# Patient Record
Sex: Male | Born: 1950 | Race: White | Hispanic: No | Marital: Single | Smoking: Former smoker
Health system: Southern US, Community
[De-identification: ages and names within clinical notes are randomized; demographics above are authoritative.]

## PROBLEM LIST (undated history)

## (undated) DIAGNOSIS — Z9109 Other allergy status, other than to drugs and biological substances: Secondary | ICD-10-CM

## (undated) DIAGNOSIS — J329 Chronic sinusitis, unspecified: Secondary | ICD-10-CM

## (undated) DIAGNOSIS — J45909 Unspecified asthma, uncomplicated: Secondary | ICD-10-CM

## (undated) DIAGNOSIS — F419 Anxiety disorder, unspecified: Secondary | ICD-10-CM

## (undated) DIAGNOSIS — J449 Chronic obstructive pulmonary disease, unspecified: Secondary | ICD-10-CM

## (undated) HISTORY — DX: Unspecified asthma, uncomplicated: J45.909

## (undated) HISTORY — PX: SPINE SURGERY: SHX786

## (undated) HISTORY — DX: Anxiety disorder, unspecified: F41.9

## (undated) HISTORY — DX: Chronic obstructive pulmonary disease, unspecified: J44.9

## (undated) HISTORY — DX: Chronic sinusitis, unspecified: J32.9

## (undated) HISTORY — DX: Other allergy status, other than to drugs and biological substances: Z91.09

---

## 2011-03-01 ENCOUNTER — Emergency Department: Payer: Self-pay | Admitting: Internal Medicine

## 2011-03-17 ENCOUNTER — Emergency Department: Payer: Self-pay | Admitting: Emergency Medicine

## 2011-04-19 ENCOUNTER — Emergency Department: Payer: Self-pay | Admitting: Internal Medicine

## 2016-12-02 DIAGNOSIS — R972 Elevated prostate specific antigen [PSA]: Secondary | ICD-10-CM | POA: Insufficient documentation

## 2016-12-21 DIAGNOSIS — C61 Malignant neoplasm of prostate: Secondary | ICD-10-CM | POA: Insufficient documentation

## 2018-01-10 ENCOUNTER — Encounter: Payer: Self-pay | Admitting: Internal Medicine

## 2018-01-10 ENCOUNTER — Ambulatory Visit (INDEPENDENT_AMBULATORY_CARE_PROVIDER_SITE_OTHER): Payer: PPO | Admitting: Internal Medicine

## 2018-01-10 VITALS — BP 137/80 | HR 68 | Resp 16 | Ht 67.0 in | Wt 177.8 lb

## 2018-01-10 DIAGNOSIS — J449 Chronic obstructive pulmonary disease, unspecified: Secondary | ICD-10-CM

## 2018-01-10 DIAGNOSIS — R0602 Shortness of breath: Secondary | ICD-10-CM | POA: Insufficient documentation

## 2018-01-10 DIAGNOSIS — Z23 Encounter for immunization: Secondary | ICD-10-CM

## 2018-01-10 DIAGNOSIS — C61 Malignant neoplasm of prostate: Secondary | ICD-10-CM

## 2018-01-10 DIAGNOSIS — J439 Emphysema, unspecified: Secondary | ICD-10-CM | POA: Insufficient documentation

## 2018-01-10 MED ORDER — ALBUTEROL SULFATE HFA 108 (90 BASE) MCG/ACT IN AERS: 2.0000 | INHALATION_SPRAY | Freq: Four times a day (QID) | RESPIRATORY_TRACT | 0 refills | Status: DC | PRN

## 2018-01-10 NOTE — Progress Notes (Signed)
[facility] [place], [place] [address]  Pulmonary Sleep Medicine  Office Visit Note  Patient Name: [name] DOB: 12-07-1951 MRN [id number]  Date of Service: 01/10/2018  Complaints/HPI: He is doing well has noted to have severe COPD based on his last PFT. He is not smoking now. He states he will be retiring soon he states. He has no admissions to the hospital. Has noted bronchitis every year. He has no cough noted. He usally gets his infection around the month of January  ROS  General: (-) fever, (-) chills, (-) night sweats, (-) weakness Skin: (-) rashes, (-) itching,. Eyes: (-) visual changes, (-) redness, (-) itching. Nose and Sinuses: (-) nasal stuffiness or itchiness, (-) postnasal drip, (-) nosebleeds, (-) sinus trouble. Mouth and Throat: (-) sore throat, (-) hoarseness. Neck: (-) swollen glands, (-) enlarged thyroid, (-) neck pain. Respiratory: - cough, (-) bloody sputum, - shortness of breath, - wheezing. Cardiovascular: - ankle swelling, (-) chest pain. Lymphatic: (-) lymph node enlargement. Neurologic: (-) numbness, (-) tingling. Psychiatric: (-) anxiety, (-) depression   Current Medication: Outpatient Encounter Medications as of 01/10/2018  Medication Sig  . Albuterol Sulfate (VENTOLIN HFA IN) Inhale 2 puffs into the lungs. Every 4 to 6 hours PRN  . budesonide-formoterol (SYMBICORT) 160-4.5 MCG/ACT inhaler Inhale 2 puffs into the lungs 2 (two) times daily.  Marland Kitchen tiotropium (SPIRIVA HANDIHALER) 18 MCG inhalation capsule Place 18 mcg into inhaler and inhale daily. 1 puff daily   No facility-administered encounter medications on file as of 01/10/2018.     Surgical History: History reviewed. No pertinent surgical history.  Medical History: Past Medical History:  Diagnosis Date  . Anxiety   . Asthma   . COPD (chronic obstructive pulmonary disease) ([place])   . Environmental allergies   . Sinusitis     Family History: Family History   Problem Relation Age of Onset  . Colon cancer Mother   . Hypertension Father     Social History: Social History   Socioeconomic History  . Marital status: Single    Spouse name: Not on file  . Number of children: Not on file  . Years of education: Not on file  . Highest education level: Not on file  Social Needs  . Financial resource strain: Not on file  . Food insecurity - worry: Not on file  . Food insecurity - inability: Not on file  . Transportation needs - medical: Not on file  . Transportation needs - non-medical: Not on file  Occupational History  . Not on file  Tobacco Use  . Smoking status: Former Research scientist (life sciences)  . Smokeless tobacco: Never Used  Substance and Sexual Activity  . Alcohol use: Not on file    Comment: occational  . Drug use: No  . Sexual activity: Not on file  Other Topics Concern  . Not on file  Social History Narrative  . Not on file    Vital Signs: Blood pressure 137/80, pulse 68, resp. rate 16, height 5\' 7"  (1.702 m), weight 177 lb 12.8 oz (80.6 kg), SpO2 98 %.  Examination: General Appearance: The patient is well-developed, well-nourished, and in no distress. Skin: Gross inspection of skin unremarkable. Head: normocephalic, no gross deformities. Eyes: no gross deformities noted. ENT: ears appear grossly normal no exudates. Neck: Supple. No thyromegaly. No LAD. Respiratory: no rales or rhonchi. Cardiovascular: Normal S1 and S2 without murmur or rub. Extremities: No cyanosis. pulses are equal. Neurologic: Alert and oriented. No involuntary movements.  LABS: No results found for this or any previous visit (from the past 2160 hour(s)).  Radiology: No results found.  No results found.  No results found.    Assessment and Plan: Patient Active Problem List   Diagnosis Date Noted  . COPD (chronic obstructive pulmonary disease) ([place]) 01/10/2018  . Emphysema, unspecified ([place]) 01/10/2018  . Shortness of breath 01/10/2018  . Prostate  cancer ([place]) 12/21/2016  . Elevated PSA 12/02/2016    1. COPD doing well he has been on symbicort but his insurance has been giving him a difficulty time with his ventolin will likely need a generic script for albuterol flu shot 2. SOB worse when he does not have his inhalers 3. Prostate cancer controlled follows with urology cancer center  General Counseling: I have discussed the findings of the evaluation and examination with [name].  I have also discussed any further diagnostic evaluation thatmay be needed or ordered today. [name] verbalizes understanding of the findings of todays visit. We also reviewed his medications today and discussed drug interactions and side effects including but not limited excessive drowsiness and altered mental states. We also discussed that there is always a risk not just to him but also people around him. he has been encouraged to call the office with any questions or concerns that should arise related to todays visit.    Time spent: 51min  I have personally obtained a history, examined the patient, evaluated laboratory and imaging results, formulated the assessment and plan and placed orders.    Dennis Gee, MD [facility] Pulmonary and Critical Care Sleep medicine

## 2018-01-10 NOTE — Patient Instructions (Signed)

## 2018-03-18 ENCOUNTER — Other Ambulatory Visit: Payer: Self-pay | Admitting: Internal Medicine

## 2018-04-18 ENCOUNTER — Other Ambulatory Visit: Payer: Self-pay | Admitting: Internal Medicine

## 2018-04-18 DIAGNOSIS — J449 Chronic obstructive pulmonary disease, unspecified: Secondary | ICD-10-CM

## 2018-06-05 DIAGNOSIS — M2550 Pain in unspecified joint: Secondary | ICD-10-CM | POA: Diagnosis not present

## 2018-06-05 DIAGNOSIS — R5381 Other malaise: Secondary | ICD-10-CM | POA: Diagnosis not present

## 2018-06-08 DIAGNOSIS — R5381 Other malaise: Secondary | ICD-10-CM | POA: Diagnosis not present

## 2018-06-08 DIAGNOSIS — R972 Elevated prostate specific antigen [PSA]: Secondary | ICD-10-CM | POA: Diagnosis not present

## 2018-07-11 ENCOUNTER — Encounter: Payer: Self-pay | Admitting: Internal Medicine

## 2018-07-11 ENCOUNTER — Ambulatory Visit (INDEPENDENT_AMBULATORY_CARE_PROVIDER_SITE_OTHER): Payer: PPO | Admitting: Internal Medicine

## 2018-07-11 VITALS — BP 126/68 | HR 85 | Resp 16 | Ht 67.0 in | Wt 169.0 lb

## 2018-07-11 DIAGNOSIS — J449 Chronic obstructive pulmonary disease, unspecified: Secondary | ICD-10-CM

## 2018-07-11 DIAGNOSIS — C61 Malignant neoplasm of prostate: Secondary | ICD-10-CM | POA: Diagnosis not present

## 2018-07-11 DIAGNOSIS — R0602 Shortness of breath: Secondary | ICD-10-CM | POA: Diagnosis not present

## 2018-07-11 NOTE — Progress Notes (Signed)
[facility] [place], [place] [address]  Pulmonary Sleep Medicine   Office Visit Note  Patient Name: [name] DOB: 03-09-1951 MRN [id number]  Date of Service: 07/11/2018  Complaints/HPI: Pt here for follow up for COPD.  He reports having to use his rescue inhaler more recently.  He attributes it to the weather, and anytime he "over does it". He denies hospital admissions, chest pain or cough at this time.  There is no anoxic hemoptysis noted at this time.  No nausea no vomiting denies having any headaches  ROS  General: (-) fever, (-) chills, (-) night sweats, (-) weakness Skin: (-) rashes, (-) itching,. Eyes: (-) visual changes, (-) redness, (-) itching. Nose and Sinuses: (-) nasal stuffiness or itchiness, (-) postnasal drip, (-) nosebleeds, (-) sinus trouble. Mouth and Throat: (-) sore throat, (-) hoarseness. Neck: (-) swollen glands, (-) enlarged thyroid, (-) neck pain. Respiratory: - cough, (-) bloody sputum, + shortness of breath, - wheezing. Cardiovascular: - ankle swelling, (-) chest pain. Lymphatic: (-) lymph node enlargement. Neurologic: (-) numbness, (-) tingling. Psychiatric: (-) anxiety, (-) depression   Current Medication: Outpatient Encounter Medications as of 07/11/2018  Medication Sig  . PROAIR HFA 108 (90 Base) MCG/ACT inhaler INHALE 2 PUFFS EVERY 4-6 HOURS AS NEEDED  . SYMBICORT 160-4.5 MCG/ACT inhaler INHALE 2 PUFFS BY MOUTH TWICE A DAY  . tiotropium (SPIRIVA HANDIHALER) 18 MCG inhalation capsule Place 18 mcg into inhaler and inhale daily. 1 puff daily   No facility-administered encounter medications on file as of 07/11/2018.     Surgical History: No past surgical history on file.  Medical History: Past Medical History:  Diagnosis Date  . Anxiety   . Asthma   . COPD (chronic obstructive pulmonary disease) ([place])   . Environmental allergies   . Sinusitis     Family History: Family History  Problem Relation Age of  Onset  . Colon cancer Mother   . Hypertension Father     Social History: Social History   Socioeconomic History  . Marital status: Single    Spouse name: Not on file  . Number of children: Not on file  . Years of education: Not on file  . Highest education level: Not on file  Occupational History  . Not on file  Social Needs  . Financial resource strain: Not on file  . Food insecurity:    Worry: Not on file    Inability: Not on file  . Transportation needs:    Medical: Not on file    Non-medical: Not on file  Tobacco Use  . Smoking status: Former Research scientist (life sciences)  . Smokeless tobacco: Never Used  Substance and Sexual Activity  . Alcohol use: Not on file    Comment: occational  . Drug use: No  . Sexual activity: Not on file  Lifestyle  . Physical activity:    Days per week: Not on file    Minutes per session: Not on file  . Stress: Not on file  Relationships  . Social connections:    Talks on phone: Not on file    Gets together: Not on file    Attends religious service: Not on file    Active member of club or organization: Not on file    Attends meetings of clubs or organizations: Not on file    Relationship status: Not on file  . Intimate partner violence:    Fear of current or ex partner: Not on file    Emotionally  abused: Not on file    Physically abused: Not on file    Forced sexual activity: Not on file  Other Topics Concern  . Not on file  Social History Narrative  . Not on file    Vital Signs: Blood pressure 126/68, pulse 85, resp. rate 16, height 5\' 7"  (1.702 m), weight 169 lb (76.7 kg), SpO2 96 %.  Examination: General Appearance: The patient is well-developed, well-nourished, and in no distress. Skin: Gross inspection of skin unremarkable. Head: normocephalic, no gross deformities. Eyes: no gross deformities noted. ENT: ears appear grossly normal no exudates. Neck: Supple. No thyromegaly. No LAD. Respiratory: Scattered distant rhonchi are  noted. Cardiovascular: Normal S1 and S2 without murmur or rub. Extremities: No cyanosis. pulses are equal. Neurologic: Alert and oriented. No involuntary movements.  LABS: No results found for this or any previous visit (from the past 2160 hour(s)).  Radiology: No results found.  No results found.  No results found.    Assessment and Plan: Patient Active Problem List   Diagnosis Date Noted  . COPD (chronic obstructive pulmonary disease) ([place]) 01/10/2018  . Emphysema, unspecified ([place]) 01/10/2018  . Shortness of breath 01/10/2018  . Prostate cancer ([place]) 12/21/2016  . Elevated PSA 12/02/2016   1. Chronic obstructive pulmonary disease, unspecified COPD type ([place]) Denies difficulty at this time.  Doing well on Symbicort. Continues to work a physical job.  - DG Chest 2 View; Future  2. SOB (shortness of breath) - Spirometry with Graph  3. Prostate cancer ([place]) Stable at this time.  Followed by oncology.     General Counseling: I have discussed the findings of the evaluation and examination with [name].  I have also discussed any further diagnostic evaluation thatmay be needed or ordered today. [name] verbalizes understanding of the findings of todays visit. We also reviewed his medications today and discussed drug interactions and side effects including but not limited excessive drowsiness and altered mental states. We also discussed that there is always a risk not just to him but also people around him. he has been encouraged to call the office with any questions or concerns that should arise related to todays visit.    Time spent: 24min This patient was seen by Orson Gear AGNP-C in Collaboration with Dr. Devona Konig as a part of collaborative care agreement. I have personally obtained a history, examined the patient, evaluated laboratory and imaging results, formulated the assessment and plan and placed orders.    Allyne Gee, MD [facility] Pulmonary and Critical Care Sleep  medicine

## 2018-07-11 NOTE — Patient Instructions (Signed)

## 2018-07-14 ENCOUNTER — Ambulatory Visit
Admission: RE | Admit: 2018-07-14 | Discharge: 2018-07-14 | Disposition: A | Discharge status: Home / Self Care | Payer: PPO | Source: Ambulatory Visit | Attending: Adult Health | Admitting: Adult Health

## 2018-07-14 ENCOUNTER — Ambulatory Visit
Admission: RE | Admit: 2018-07-14 | Discharge: 2018-07-14 | Disposition: A | Discharge status: Home / Self Care | Payer: PPO | Source: Ambulatory Visit | Attending: Internal Medicine | Admitting: Internal Medicine

## 2018-07-14 DIAGNOSIS — J449 Chronic obstructive pulmonary disease, unspecified: Secondary | ICD-10-CM | POA: Diagnosis not present

## 2018-07-20 ENCOUNTER — Ambulatory Visit: Payer: Self-pay | Admitting: Urology

## 2018-08-08 ENCOUNTER — Other Ambulatory Visit: Payer: Self-pay | Admitting: Internal Medicine

## 2018-08-09 ENCOUNTER — Telehealth: Payer: Self-pay

## 2018-08-09 ENCOUNTER — Other Ambulatory Visit: Payer: Self-pay | Admitting: Internal Medicine

## 2018-08-09 NOTE — Telephone Encounter (Signed)
Error

## 2018-09-11 ENCOUNTER — Other Ambulatory Visit: Payer: Self-pay | Admitting: Internal Medicine

## 2018-09-11 DIAGNOSIS — J449 Chronic obstructive pulmonary disease, unspecified: Secondary | ICD-10-CM

## 2018-11-10 DIAGNOSIS — K2900 Acute gastritis without bleeding: Secondary | ICD-10-CM | POA: Diagnosis not present

## 2018-11-11 ENCOUNTER — Other Ambulatory Visit: Payer: Self-pay | Admitting: Adult Health

## 2018-11-13 DIAGNOSIS — R42 Dizziness and giddiness: Secondary | ICD-10-CM | POA: Diagnosis not present

## 2018-11-13 DIAGNOSIS — R109 Unspecified abdominal pain: Secondary | ICD-10-CM | POA: Diagnosis not present

## 2018-11-13 DIAGNOSIS — R197 Diarrhea, unspecified: Secondary | ICD-10-CM | POA: Diagnosis not present

## 2018-11-13 DIAGNOSIS — K2900 Acute gastritis without bleeding: Secondary | ICD-10-CM | POA: Diagnosis not present

## 2018-12-15 DIAGNOSIS — M25561 Pain in right knee: Secondary | ICD-10-CM | POA: Diagnosis not present

## 2018-12-19 DIAGNOSIS — S76911A Strain of unspecified muscles, fascia and tendons at thigh level, right thigh, initial encounter: Secondary | ICD-10-CM | POA: Diagnosis not present

## 2018-12-19 DIAGNOSIS — M1711 Unilateral primary osteoarthritis, right knee: Secondary | ICD-10-CM | POA: Diagnosis not present

## 2019-01-07 ENCOUNTER — Other Ambulatory Visit: Payer: Self-pay | Admitting: Internal Medicine

## 2019-01-07 DIAGNOSIS — J449 Chronic obstructive pulmonary disease, unspecified: Secondary | ICD-10-CM

## 2019-01-08 ENCOUNTER — Other Ambulatory Visit: Payer: Self-pay | Admitting: Adult Health

## 2019-01-10 ENCOUNTER — Other Ambulatory Visit: Payer: Self-pay | Admitting: Internal Medicine

## 2019-01-18 ENCOUNTER — Encounter: Payer: Self-pay | Admitting: Adult Health

## 2019-01-18 ENCOUNTER — Encounter (INDEPENDENT_AMBULATORY_CARE_PROVIDER_SITE_OTHER): Payer: Self-pay

## 2019-01-18 ENCOUNTER — Ambulatory Visit: Payer: PPO | Admitting: Adult Health

## 2019-01-18 VITALS — BP 132/76 | HR 73 | Resp 16 | Ht 67.0 in | Wt 176.0 lb

## 2019-01-18 DIAGNOSIS — J45909 Unspecified asthma, uncomplicated: Secondary | ICD-10-CM | POA: Diagnosis not present

## 2019-01-18 DIAGNOSIS — J449 Chronic obstructive pulmonary disease, unspecified: Secondary | ICD-10-CM

## 2019-01-18 DIAGNOSIS — R0602 Shortness of breath: Secondary | ICD-10-CM

## 2019-01-18 NOTE — Patient Instructions (Signed)
Chronic Obstructive Pulmonary Disease Chronic obstructive pulmonary disease (COPD) is a long-term (chronic) lung problem. When you have COPD, it is hard for air to get in and out of your lungs. Usually the condition gets worse over time, and your lungs will never return to normal. There are things you can do to keep yourself as healthy as possible.  Your doctor may treat your condition with: ? Medicines. ? Oxygen. ? Lung surgery.  Your doctor may also recommend: ? Rehabilitation. This includes steps to make your body work better. It may involve a team of specialists. ? Quitting smoking, if you smoke. ? Exercise and changes to your diet. ? Comfort measures (palliative care). Follow these instructions at home: Medicines  Take over-the-counter and prescription medicines only as told by your doctor.  Talk to your doctor before taking any cough or allergy medicines. You may need to avoid medicines that cause your lungs to be dry. Lifestyle  If you smoke, stop. Smoking makes the problem worse. If you need help quitting, ask your doctor.  Avoid being around things that make your breathing worse. This may include smoke, chemicals, and fumes.  Stay active, but remember to rest as well.  Learn and use tips on how to relax.  Make sure you get enough sleep. Most adults need at least 7 hours of sleep every night.  Eat healthy foods. Eat smaller meals more often. Rest before meals. Controlled breathing Learn and use tips on how to control your breathing as told by your doctor. Try:  Breathing in (inhaling) through your nose for 1 second. Then, pucker your lips and breath out (exhale) through your lips for 2 seconds.  Putting one hand on your belly (abdomen). Breathe in slowly through your nose for 1 second. Your hand on your belly should move out. Pucker your lips and breathe out slowly through your lips. Your hand on your belly should move in as you breathe out.  Controlled coughing Learn  and use controlled coughing to clear mucus from your lungs. Follow these steps: 1. Lean your head a little forward. 2. Breathe in deeply. 3. Try to hold your breath for 3 seconds. 4. Keep your mouth slightly open while coughing 2 times. 5. Spit any mucus out into a tissue. 6. Rest and do the steps again 1 or 2 times as needed. General instructions  Make sure you get all the shots (vaccines) that your doctor recommends. Ask your doctor about a flu shot and a pneumonia shot.  Use oxygen therapy and pulmonary rehabilitation if told by your doctor. If you need home oxygen therapy, ask your doctor if you should buy a tool to measure your oxygen level (oximeter).  Make a COPD action plan with your doctor. This helps you to know what to do if you feel worse than usual.  Manage any other conditions you have as told by your doctor.  Avoid going outside when it is very hot, cold, or humid.  Avoid people who have a sickness you can catch (contagious).  Keep all follow-up visits as told by your doctor. This is important. Contact a doctor if:  You cough up more mucus than usual.  There is a change in the color or thickness of the mucus.  It is harder to breathe than usual.  Your breathing is faster than usual.  You have trouble sleeping.  You need to use your medicines more often than usual.  You have trouble doing your normal activities such as getting dressed   or walking around the house. Get help right away if:  You have shortness of breath while resting.  You have shortness of breath that stops you from: ? Being able to talk. ? Doing normal activities.  Your chest hurts for longer than 5 minutes.  Your skin color is more blue than usual.  Your pulse oximeter shows that you have low oxygen for longer than 5 minutes.  You have a fever.  You feel too tired to breathe normally. Summary  Chronic obstructive pulmonary disease (COPD) is a long-term lung problem.  The way your  lungs work will never return to normal. Usually the condition gets worse over time. There are things you can do to keep yourself as healthy as possible.  Take over-the-counter and prescription medicines only as told by your doctor.  If you smoke, stop. Smoking makes the problem worse. This information is not intended to replace advice given to you by your health care provider. Make sure you discuss any questions you have with your health care provider. Document Released: 05/17/2008 Document Revised: 01/03/2017 Document Reviewed: 01/03/2017 Elsevier Interactive Patient Education  2019 Elsevier Inc.  

## 2019-01-18 NOTE — Progress Notes (Signed)
[facility] [place], [place] [address]  Pulmonary Sleep Medicine   Office Visit Note  Patient Name: [name] DOB: 06-08-1951 MRN [id number]  Date of Service: 01/18/2019  Complaints/HPI: Pt is here for follow up.  Patient reports overall he is doing well.  Denies any hospital admissions, chest pain, cough, shortness of breath or other issues.   ROS  General: (-) fever, (-) chills, (-) night sweats, (-) weakness Skin: (-) rashes, (-) itching,. Eyes: (-) visual changes, (-) redness, (-) itching. Nose and Sinuses: (-) nasal stuffiness or itchiness, (-) postnasal drip, (-) nosebleeds, (-) sinus trouble. Mouth and Throat: (-) sore throat, (-) hoarseness. Neck: (-) swollen glands, (-) enlarged thyroid, (-) neck pain. Respiratory: - cough, (-) bloody sputum, - shortness of breath, - wheezing. Cardiovascular: - ankle swelling, (-) chest pain. Lymphatic: (-) lymph node enlargement. Neurologic: (-) numbness, (-) tingling. Psychiatric: (-) anxiety, (-) depression   Current Medication: Outpatient Encounter Medications as of 01/18/2019  Medication Sig  . albuterol (PROVENTIL HFA;VENTOLIN HFA) 108 (90 Base) MCG/ACT inhaler INHALE 2 PUFFS BY MOUTH EVERY 4 TO 6 HOURS AS NEEDED  . meloxicam (MOBIC) 15 MG tablet meloxicam 15 mg tablet  TAKE 1 TABLET BY MOUTH EVERY DAY  . SPIRIVA HANDIHALER 18 MCG inhalation capsule INHALE 1 PUFF BY MOUTH EVERY DAY  . SYMBICORT 160-4.5 MCG/ACT inhaler TAKE 2 PUFFS BY MOUTH TWICE A DAY   No facility-administered encounter medications on file as of 01/18/2019.     Surgical History: History reviewed. No pertinent surgical history.  Medical History: Past Medical History:  Diagnosis Date  . Anxiety   . Asthma   . COPD (chronic obstructive pulmonary disease) ([place])   . Environmental allergies   . Sinusitis     Family History: Family History  Problem Relation Age of Onset  . Colon cancer Mother   . Hypertension Father      Social History: Social History   Socioeconomic History  . Marital status: Single    Spouse name: Not on file  . Number of children: Not on file  . Years of education: Not on file  . Highest education level: Not on file  Occupational History  . Not on file  Social Needs  . Financial resource strain: Not on file  . Food insecurity:    Worry: Not on file    Inability: Not on file  . Transportation needs:    Medical: Not on file    Non-medical: Not on file  Tobacco Use  . Smoking status: Former Research scientist (life sciences)  . Smokeless tobacco: Never Used  Substance and Sexual Activity  . Alcohol use: Not on file    Comment: occational  . Drug use: No  . Sexual activity: Not on file  Lifestyle  . Physical activity:    Days per week: Not on file    Minutes per session: Not on file  . Stress: Not on file  Relationships  . Social connections:    Talks on phone: Not on file    Gets together: Not on file    Attends religious service: Not on file    Active member of club or organization: Not on file    Attends meetings of clubs or organizations: Not on file    Relationship status: Not on file  . Intimate partner violence:    Fear of current or ex partner: Not on file    Emotionally abused: Not on file    Physically abused: Not on file  Forced sexual activity: Not on file  Other Topics Concern  . Not on file  Social History Narrative  . Not on file    Vital Signs: Blood pressure 132/76, pulse 73, resp. rate 16, height 5\' 7"  (1.702 m), weight 176 lb (79.8 kg), SpO2 96 %.  Examination: General Appearance: The patient is well-developed, well-nourished, and in no distress. Skin: Gross inspection of skin unremarkable. Head: normocephalic, no gross deformities. Eyes: no gross deformities noted. ENT: ears appear grossly normal no exudates. Neck: Supple. No thyromegaly. No LAD. Respiratory: clear bilaterally. Cardiovascular: Normal S1 and S2 without murmur or rub. Extremities: No  cyanosis. pulses are equal. Neurologic: Alert and oriented. No involuntary movements.  LABS: No results found for this or any previous visit (from the past 2160 hour(s)).  Radiology: Dg Chest 2 View  Result Date: 07/14/2018 CLINICAL DATA:  COPD. EXAM: CHEST - 2 VIEW COMPARISON:  03/01/2011 chest radiograph. FINDINGS: Stable cardiomediastinal silhouette with normal heart size. No pneumothorax. No pleural effusion. Borderline mild lung hyperinflation. No pulmonary edema. No acute consolidative airspace disease. IMPRESSION: No acute cardiopulmonary disease. Borderline mild lung hyperinflation, compatible with the provided history of COPD. Electronically Signed   By: Ilona Sorrel M.D.   On: 07/14/2018 16:34    No results found.  No results found.    Assessment and Plan: Patient Active Problem List   Diagnosis Date Noted  . COPD (chronic obstructive pulmonary disease) ([place]) 01/10/2018  . Emphysema, unspecified ([place]) 01/10/2018  . Shortness of breath 01/10/2018  . Prostate cancer ([place]) 12/21/2016  . Elevated PSA 12/02/2016    1. Chronic obstructive pulmonary disease, unspecified COPD type ([place]) Doing well at this time.  Currently uses Spiriva and Symbicort as well as an Adderall rescue inhaler and is doing well with these at this time.  Continue present management.  2. Asthma due to environmental allergies Stable at this time.  Continue using inhalers and medications as prescribed.  3. SOB (shortness of breath) FVC is 2.4 which is 58% of the pre-predicted value FEV1 is 1.3 which is 82% of the pre-predicted value FEV1/FVC is 53 9 which is 71% of the pre-predicted value on today spirometry - Spirometry with Graph  General Counseling: I have discussed the findings of the evaluation and examination with [name].  I have also discussed any further diagnostic evaluation thatmay be needed or ordered today. Larone verbalizes understanding of the findings of todays visit. We also reviewed his  medications today and discussed drug interactions and side effects including but not limited excessive drowsiness and altered mental states. We also discussed that there is always a risk not just to him but also people around him. he has been encouraged to call the office with any questions or concerns that should arise related to todays visit.    Time spent: 25 This patient was seen by Orson Gear AGNP-C in Collaboration with Dr. Devona Konig as a part of collaborative care agreement.  I have personally obtained a history, examined the patient, evaluated laboratory and imaging results, formulated the assessment and plan and placed orders.    Allyne Gee, MD [facility] Pulmonary and Critical Care Sleep medicine

## 2019-02-02 DIAGNOSIS — R030 Elevated blood-pressure reading, without diagnosis of hypertension: Secondary | ICD-10-CM | POA: Diagnosis not present

## 2019-02-02 DIAGNOSIS — J019 Acute sinusitis, unspecified: Secondary | ICD-10-CM | POA: Diagnosis not present

## 2019-02-02 DIAGNOSIS — J209 Acute bronchitis, unspecified: Secondary | ICD-10-CM | POA: Diagnosis not present

## 2019-02-02 DIAGNOSIS — B9689 Other specified bacterial agents as the cause of diseases classified elsewhere: Secondary | ICD-10-CM | POA: Diagnosis not present

## 2019-02-08 ENCOUNTER — Encounter: Payer: Self-pay | Admitting: Emergency Medicine

## 2019-02-08 ENCOUNTER — Emergency Department: Payer: PPO

## 2019-02-08 ENCOUNTER — Emergency Department
Admission: EM | Admit: 2019-02-08 | Discharge: 2019-02-08 | Disposition: A | Discharge status: Home / Self Care | Payer: PPO | Attending: Emergency Medicine | Admitting: Emergency Medicine

## 2019-02-08 DIAGNOSIS — Z8546 Personal history of malignant neoplasm of prostate: Secondary | ICD-10-CM | POA: Diagnosis not present

## 2019-02-08 DIAGNOSIS — J40 Bronchitis, not specified as acute or chronic: Secondary | ICD-10-CM | POA: Diagnosis not present

## 2019-02-08 DIAGNOSIS — R0981 Nasal congestion: Secondary | ICD-10-CM | POA: Diagnosis not present

## 2019-02-08 DIAGNOSIS — R0789 Other chest pain: Secondary | ICD-10-CM | POA: Insufficient documentation

## 2019-02-08 DIAGNOSIS — J449 Chronic obstructive pulmonary disease, unspecified: Secondary | ICD-10-CM | POA: Diagnosis not present

## 2019-02-08 DIAGNOSIS — R0602 Shortness of breath: Secondary | ICD-10-CM | POA: Diagnosis not present

## 2019-02-08 DIAGNOSIS — R079 Chest pain, unspecified: Secondary | ICD-10-CM | POA: Diagnosis not present

## 2019-02-08 DIAGNOSIS — Z87891 Personal history of nicotine dependence: Secondary | ICD-10-CM | POA: Diagnosis not present

## 2019-02-08 DIAGNOSIS — R05 Cough: Secondary | ICD-10-CM | POA: Diagnosis not present

## 2019-02-08 DIAGNOSIS — R531 Weakness: Secondary | ICD-10-CM | POA: Diagnosis not present

## 2019-02-08 LAB — BASIC METABOLIC PANEL
Anion gap: 10 (ref 5–15)
BUN: 16 mg/dL (ref 8–23)
CO2: 25 mmol/L (ref 22–32)
Calcium: 9.5 mg/dL (ref 8.9–10.3)
Chloride: 100 mmol/L (ref 98–111)
Creatinine, Ser: 1 mg/dL (ref 0.61–1.24)
GFR calc Af Amer: >60 mL/min (ref >60)
GFR calc non Af Amer: >60 mL/min (ref >60)
Glucose, Bld: 102 mg/dL — ABNORMAL HIGH (ref 70–99)
Potassium: 4.1 mmol/L (ref 3.5–5.1)
Sodium: 135 mmol/L (ref 135–145)

## 2019-02-08 LAB — CBC
HEMATOCRIT: 42 % (ref 39.0–52.0)
HEMOGLOBIN: 14.5 g/dL (ref 13.0–17.0)
MCH: 30.2 pg (ref 26.0–34.0)
MCHC: 34.5 g/dL (ref 30.0–36.0)
MCV: 87.5 fL (ref 80.0–100.0)
Platelets: 173 10*3/uL (ref 150–400)
RBC: 4.8 MIL/uL (ref 4.22–5.81)
RDW: 13.2 % (ref 11.5–15.5)
WBC: 8.8 10*3/uL (ref 4.0–10.5)
nRBC: 0 % (ref 0.0–0.2)

## 2019-02-08 LAB — TROPONIN I
Troponin I: <0.03 ng/mL (ref <0.03)
Troponin I: <0.03 ng/mL (ref <0.03)

## 2019-02-08 MED ORDER — DEXAMETHASONE SODIUM PHOSPHATE 10 MG/ML IJ SOLN
10.0000 mg | Freq: Once | INTRAMUSCULAR | Status: AC
  Administered 2019-02-08: 10 mg via INTRAMUSCULAR
  Filled 2019-02-08: qty 1

## 2019-02-08 MED ORDER — IPRATROPIUM-ALBUTEROL 0.5-2.5 (3) MG/3ML IN SOLN
3.0000 mL | Freq: Once | RESPIRATORY_TRACT | Status: AC
  Administered 2019-02-08: 3 mL via RESPIRATORY_TRACT
  Filled 2019-02-08: qty 3

## 2019-02-08 NOTE — ED Triage Notes (Signed)
Pt reports he has been taking an antibiotic and he still has 7 days left of it.

## 2019-02-08 NOTE — ED Triage Notes (Signed)
Pt reports that he went to the walk in clinic to follow up from a cold he had because he was still having some coughing and wheezing. Pt states they sent him over here due to the continued sx's.

## 2019-02-08 NOTE — Discharge Instructions (Signed)
In addition to the medication you are currently taking, you may also take Mucinex (guaifenesin) to help with cough and congestion.  Follow up with primary care for symptoms that have not improved by Monday.   Return to the ER if you notice shortness of breath or chest pain/tightness are worsening.

## 2019-02-08 NOTE — ED Provider Notes (Signed)
[facility] Emergency Department Provider Note  ____________________________________________   First MD Initiated Contact with Patient 02/08/19 2045     (approximate)  I have reviewed the triage vital signs and the nursing notes.   HISTORY  Chief Complaint Chest Pain; Chills; Weakness; and Shortness of Breath  HPI [name] is a 68 y.o. male who presents to the ER for flu like symptoms x 7 days. Cough, congestion, chest tightness. Five days ago he saw the [facility] and was given Doxycycline and benzonate. Complaint today is that he is not completely better and has to work over the weekend and wants something to help with his symptoms.    Past Medical History:  Diagnosis Date  . Anxiety   . Asthma   . COPD (chronic obstructive pulmonary disease) ([place])   . Environmental allergies   . Sinusitis     Patient Active Problem List   Diagnosis Date Noted  . COPD (chronic obstructive pulmonary disease) ([place]) 01/10/2018  . Emphysema, unspecified ([place]) 01/10/2018  . Shortness of breath 01/10/2018  . Prostate cancer ([place]) 12/21/2016  . Elevated PSA 12/02/2016    History reviewed. No pertinent surgical history.  Prior to Admission medications   Medication Sig Start Date End Date Taking? Authorizing Provider  albuterol (PROVENTIL HFA;VENTOLIN HFA) 108 (90 Base) MCG/ACT inhaler INHALE 2 PUFFS BY MOUTH EVERY 4 TO 6 HOURS AS NEEDED 01/08/19   Scar[place], Audie Clear, NP  meloxicam (MOBIC) 15 MG tablet meloxicam 15 mg tablet  TAKE 1 TABLET BY MOUTH EVERY DAY    [provider]  SPIRIVA HANDIHALER 18 MCG inhalation capsule INHALE 1 PUFF BY MOUTH EVERY DAY 01/08/19   Kendell Bane, NP  SYMBICORT 160-4.5 MCG/ACT inhaler TAKE 2 PUFFS BY MOUTH TWICE A DAY 01/10/19   Kendell Bane, NP    Allergies Penicillins and Codeine  Family History  Problem Relation Age of Onset  . Colon cancer Mother   . Hypertension Father     Social History Social  History   Tobacco Use  . Smoking status: Former Research scientist (life sciences)  . Smokeless tobacco: Never Used  Substance Use Topics  . Alcohol use: Not on file    Comment: occational  . Drug use: No    Review of Systems  Constitutional: No fever/positive for chills Eyes: No visual changes. ENT: No sore throat. Cardiovascular: Denies chest pain. Respiratory: Positive for shortness of breath. Gastrointestinal: No abdominal pain.  No nausea, no vomiting.  No diarrhea. Genitourinary: Negative for dysuria. Musculoskeletal: Negative for back pain. Skin: Negative for rash. Neurological: Negative for headaches, focal weakness or numbness. ____________________________________________   PHYSICAL EXAM:  VITAL SIGNS: ED Triage Vitals  Enc Vitals Group     BP 02/08/19 1400 (!) 162/86     Pulse Rate 02/08/19 1400 71     Resp --      Temp 02/08/19 1400 98.9 F (37.2 C)     Temp Source 02/08/19 1400 Oral     SpO2 02/08/19 1400 97 %     Weight 02/08/19 1400 177 lb (80.3 kg)     Height 02/08/19 1400 5\' 7"  (1.702 m)     Head Circumference --      Peak Flow --      Pain Score 02/08/19 1406 0     Pain Loc --      Pain Edu? --      Excl. in [place]? --     Constitutional: Alert and oriented. Well appearing  and in no acute distress. Eyes: Conjunctivae are normal. Head: Atraumatic. Nose: No congestion/rhinnorhea. Mouth/Throat: Mucous membranes are moist.  Oropharynx mildly erythematous.  Neck: No stridor.   Cardiovascular: Normal rate, regular rhythm. Grossly normal heart sounds.  Good peripheral circulation. Respiratory: Normal respiratory effort.  No retractions. Faint expiratory wheezing. Gastrointestinal: Soft and nontender. No distention. No abdominal bruits. Musculoskeletal: No lower extremity tenderness nor edema.  No joint effusions. Neurologic:  Normal speech and language. No gross focal neurologic deficits are appreciated. No gait instability. Skin:  Skin is warm, dry and intact. No rash  noted. Psychiatric: Mood and affect are normal. Speech and behavior are normal. ___________________________________________   LABS (all labs ordered are listed, but only abnormal results are displayed)  Labs Reviewed  BASIC METABOLIC PANEL - Abnormal; Notable for the following components:      Result Value   Glucose, Bld 102 (*)    All other components within normal limits  CBC  TROPONIN I  TROPONIN I   ____________________________________________  EKG ED ECG REPORT I, Delle Andrzejewski, FNP-BC personally viewed and interpreted this ECG.   Date: 02/08/2019  EKG Time: 1356  Rate: 69  Rhythm: normal EKG, normal sinus rhythm  Axis: normal  Intervals:none  ST&T Change: No ST elevation.  ____________________________________________  RADIOLOGY  ED MD interpretation:  No acute cardiopulmonary abnormality per radiology.  Official radiology report(s): Dg Chest 2 View  Result Date: 02/08/2019 CLINICAL DATA:  68 y/o  M; chest pain and productive cough. EXAM: CHEST - 2 VIEW COMPARISON:  07/14/2018 chest radiograph FINDINGS: Stable heart size and mediastinal contours are within normal limits. Both lungs are clear. The visualized skeletal structures are unremarkable. IMPRESSION: No acute pulmonary process identified. Electronically Signed   By: Kristine Garbe M.D.   On: 02/08/2019 15:07    ____________________________________________   PROCEDURES  Procedure(s) performed (including Critical Care):  Procedures   ____________________________________________   INITIAL IMPRESSION / ASSESSMENT AND PLAN / ED COURSE  As part of my medical decision making, I reviewed the following data within the electronic MEDICAL RECORD NUMBER Notes from prior Internal Medicine appointment.    68 year old male presents to the emergency department for treatment and evaluation of URI symptoms with cough and wheeze.  Despite taking doxycycline and using his inhalers, he has still had some  coughing and wheezing and does not feel that he is going to be able to go to work this weekend.  He has had some chest tightness but relates that more to COPD/emphysema.  2 serial troponins were negative.  Chest x-ray showed no concern for acute cardiopulmonary abnormalities.  EKG was reassuring as well.  There is no ST segment elevation.  Oxygen saturation was in the high 90s.  He does have some expiratory wheezing and was given a DuoNeb with significant improvement.  The patient states that when he takes prednisone, he has joint soreness and prefers not to take it if at all possible therefore he will be given an IM injection of Decadron.  Patient was advised that he may need to see primary care in a few days if he notices that the wheezing has not resolved.  He was encouraged to continue using his inhalers as prescribed.  He will finish taking the doxycycline as prescribed as well.  He was advised that he may also take guaifenesin to help with cough and congestion.  He will be provided a work note for the weekend.  He was encouraged to return to the emergency department for symptoms  of change or worsen if he is unable to schedule an appointment with primary care.      ____________________________________________   FINAL CLINICAL IMPRESSION(S) / ED DIAGNOSES  Final diagnoses:  Bronchitis     ED Discharge Orders    None       Note:  This document was prepared using Dragon voice recognition software and may include unintentional dictation errors.    Victorino Dike, FNP 02/08/19 2252    Harvest Dark, MD 02/08/19 2329

## 2019-03-11 ENCOUNTER — Other Ambulatory Visit: Payer: Self-pay | Admitting: Adult Health

## 2019-03-30 ENCOUNTER — Other Ambulatory Visit: Payer: Self-pay | Admitting: Adult Health

## 2019-03-30 DIAGNOSIS — J449 Chronic obstructive pulmonary disease, unspecified: Secondary | ICD-10-CM

## 2019-06-08 ENCOUNTER — Other Ambulatory Visit: Payer: Self-pay | Admitting: Internal Medicine

## 2019-06-21 ENCOUNTER — Telehealth: Payer: Self-pay

## 2019-06-21 NOTE — Telephone Encounter (Signed)
Gave Orders for [facility] for pt Oxygen supply RX. [name]

## 2019-07-05 ENCOUNTER — Other Ambulatory Visit: Payer: Self-pay | Admitting: Adult Health

## 2019-07-09 ENCOUNTER — Other Ambulatory Visit: Payer: Self-pay

## 2019-07-09 MED ORDER — BUDESONIDE-FORMOTEROL FUMARATE 160-4.5 MCG/ACT IN AERO: INHALATION_SPRAY | RESPIRATORY_TRACT | 4 refills | Status: DC

## 2019-07-23 ENCOUNTER — Encounter: Payer: Self-pay | Admitting: Internal Medicine

## 2019-07-23 ENCOUNTER — Other Ambulatory Visit: Payer: Self-pay

## 2019-07-23 ENCOUNTER — Ambulatory Visit: Payer: PPO | Admitting: Internal Medicine

## 2019-07-23 VITALS — BP 184/84 | HR 66 | Resp 16 | Ht 67.0 in | Wt 186.0 lb

## 2019-07-23 DIAGNOSIS — R0602 Shortness of breath: Secondary | ICD-10-CM | POA: Diagnosis not present

## 2019-07-23 DIAGNOSIS — J45909 Unspecified asthma, uncomplicated: Secondary | ICD-10-CM

## 2019-07-23 DIAGNOSIS — J449 Chronic obstructive pulmonary disease, unspecified: Secondary | ICD-10-CM

## 2019-07-23 MED ORDER — ALBUTEROL SULFATE (2.5 MG/3ML) 0.083% IN NEBU: 2.5000 mg | INHALATION_SOLUTION | Freq: Four times a day (QID) | RESPIRATORY_TRACT | 1 refills | Status: AC | PRN

## 2019-07-23 NOTE — Progress Notes (Signed)
[facility] [place], [place] [address]  Pulmonary Sleep Medicine   Office Visit Note  Patient Name: [name] DOB: 02/11/1951 MRN [id number]  Date of Service: 07/23/2019  Complaints/HPI: Pt is here for 6 month follow up. Pt reports overall he is doing well.  Pt is using Symbicort, spiriva and pro-air as prescribed.  He denies needs at this time.  His bp is elevated, and he reports he is working with his PCP to address this.    ROS  General: (-) fever, (-) chills, (-) night sweats, (-) weakness Skin: (-) rashes, (-) itching,. Eyes: (-) visual changes, (-) redness, (-) itching. Nose and Sinuses: (-) nasal stuffiness or itchiness, (-) postnasal drip, (-) nosebleeds, (-) sinus trouble. Mouth and Throat: (-) sore throat, (-) hoarseness. Neck: (-) swollen glands, (-) enlarged thyroid, (-) neck pain. Respiratory: - cough, (-) bloody sputum, - shortness of breath, - wheezing. Cardiovascular: - ankle swelling, (-) chest pain. Lymphatic: (-) lymph node enlargement. Neurologic: (-) numbness, (-) tingling. Psychiatric: (-) anxiety, (-) depression   Current Medication: Outpatient Encounter Medications as of 07/23/2019  Medication Sig  . budesonide-formoterol (SYMBICORT) 160-4.5 MCG/ACT inhaler TAKE 2 PUFFS BY MOUTH TWICE A DAY  . meloxicam (MOBIC) 15 MG tablet meloxicam 15 mg tablet  TAKE 1 TABLET BY MOUTH EVERY DAY  . PROAIR HFA 108 (90 Base) MCG/ACT inhaler TAKE 2 PUFFS BY MOUTH EVERY 4 TO 6 HOURS AS NEEDED  . SPIRIVA HANDIHALER 18 MCG inhalation capsule INHALE 1 CAPSULE VIA HANDIHALER ONCE DAILY AT THE SAME TIME EVERY DAY   No facility-administered encounter medications on file as of 07/23/2019.     Surgical History: No past surgical history on file.  Medical History: Past Medical History:  Diagnosis Date  . Anxiety   . Asthma   . COPD (chronic obstructive pulmonary disease) ([place])   . Environmental allergies   . Sinusitis     Family  History: Family History  Problem Relation Age of Onset  . Colon cancer Mother   . Hypertension Father     Social History: Social History   Socioeconomic History  . Marital status: Single    Spouse name: Not on file  . Number of children: Not on file  . Years of education: Not on file  . Highest education level: Not on file  Occupational History  . Not on file  Social Needs  . Financial resource strain: Not on file  . Food insecurity    Worry: Not on file    Inability: Not on file  . Transportation needs    Medical: Not on file    Non-medical: Not on file  Tobacco Use  . Smoking status: Former Research scientist (life sciences)  . Smokeless tobacco: Never Used  Substance and Sexual Activity  . Alcohol use: Not on file    Comment: occational  . Drug use: No  . Sexual activity: Not on file  Lifestyle  . Physical activity    Days per week: Not on file    Minutes per session: Not on file  . Stress: Not on file  Relationships  . Social Herbalist on phone: Not on file    Gets together: Not on file    Attends religious service: Not on file    Active member of club or organization: Not on file    Attends meetings of clubs or organizations: Not on file    Relationship status: Not on file  . Intimate partner violence  Fear of current or ex partner: Not on file    Emotionally abused: Not on file    Physically abused: Not on file    Forced sexual activity: Not on file  Other Topics Concern  . Not on file  Social History Narrative  . Not on file    Vital Signs: Blood pressure (!) 184/84, pulse 66, resp. rate 16, height 5\' 7"  (1.702 m), weight 186 lb (84.4 kg), SpO2 94 %.  Examination: General Appearance: The patient is well-developed, well-nourished, and in no distress. Skin: Gross inspection of skin unremarkable. Head: normocephalic, no gross deformities. Eyes: no gross deformities noted. ENT: ears appear grossly normal no exudates. Neck: Supple. No thyromegaly. No  LAD. Respiratory: clear bilaterally. Cardiovascular: Normal S1 and S2 without murmur or rub. Extremities: No cyanosis. pulses are equal. Neurologic: Alert and oriented. No involuntary movements.  LABS: No results found for this or any previous visit (from the past 2160 hour(s)).  Radiology: Dg Chest 2 View  Result Date: 02/08/2019 CLINICAL DATA:  68 y/o  M; chest pain and productive cough. EXAM: CHEST - 2 VIEW COMPARISON:  07/14/2018 chest radiograph FINDINGS: Stable heart size and mediastinal contours are within normal limits. Both lungs are clear. The visualized skeletal structures are unremarkable. IMPRESSION: No acute pulmonary process identified. Electronically Signed   By: Kristine Garbe M.D.   On: 02/08/2019 15:07    No results found.  No results found.    Assessment and Plan: Patient Active Problem List   Diagnosis Date Noted  . COPD (chronic obstructive pulmonary disease) ([place]) 01/10/2018  . Emphysema, unspecified ([place]) 01/10/2018  . Shortness of breath 01/10/2018  . Prostate cancer ([place]) 12/21/2016  . Elevated PSA 12/02/2016   1. Chronic obstructive pulmonary disease, unspecified COPD type ([place]) Continue to use inhalers as directed.  Filled patients albuterol nebulizer solution.   - albuterol (PROVENTIL) (2.5 MG/3ML) 0.083% nebulizer solution; Take 3 mLs (2.5 mg total) by nebulization every 6 (six) hours as needed for wheezing or shortness of breath.  Dispense: 75 mL; Refill: 1  2. Asthma due to environmental allergies Stable, continue present management.    3. SOB (shortness of breath) - Spirometry with Graph   General Counseling: I have discussed the findings of the evaluation and examination with [name].  I have also discussed any further diagnostic evaluation thatmay be needed or ordered today. [name] verbalizes understanding of the findings of todays visit. We also reviewed his medications today and discussed drug interactions and side effects including  but not limited excessive drowsiness and altered mental states. We also discussed that there is always a risk not just to him but also people around him. he has been encouraged to call the office with any questions or concerns that should arise related to todays visit.    Time spent: 15 This patient was seen by Orson Gear AGNP-C in Collaboration with Dr. Devona Konig as a part of collaborative care agreement.   I have personally obtained a history, examined the patient, evaluated laboratory and imaging results, formulated the assessment and plan and placed orders.    Allyne Gee, MD [facility] Pulmonary and Critical Care Sleep medicine

## 2019-08-07 ENCOUNTER — Other Ambulatory Visit: Payer: Self-pay | Admitting: Adult Health

## 2019-08-07 DIAGNOSIS — J449 Chronic obstructive pulmonary disease, unspecified: Secondary | ICD-10-CM

## 2019-09-07 ENCOUNTER — Other Ambulatory Visit: Payer: Self-pay

## 2019-11-16 ENCOUNTER — Other Ambulatory Visit: Payer: Self-pay | Admitting: Adult Health

## 2019-12-21 ENCOUNTER — Other Ambulatory Visit: Payer: Self-pay | Admitting: Adult Health

## 2019-12-26 IMAGING — CR DG CHEST 2V
2 series · 2 of 2 positions shown · non-contrast
Comparison: 07/14/2018 chest radiograph

CLINICAL DATA: 67 y/o  M; chest pain and productive cough.

EXAM:
CHEST - 2 VIEW

[chest pa]
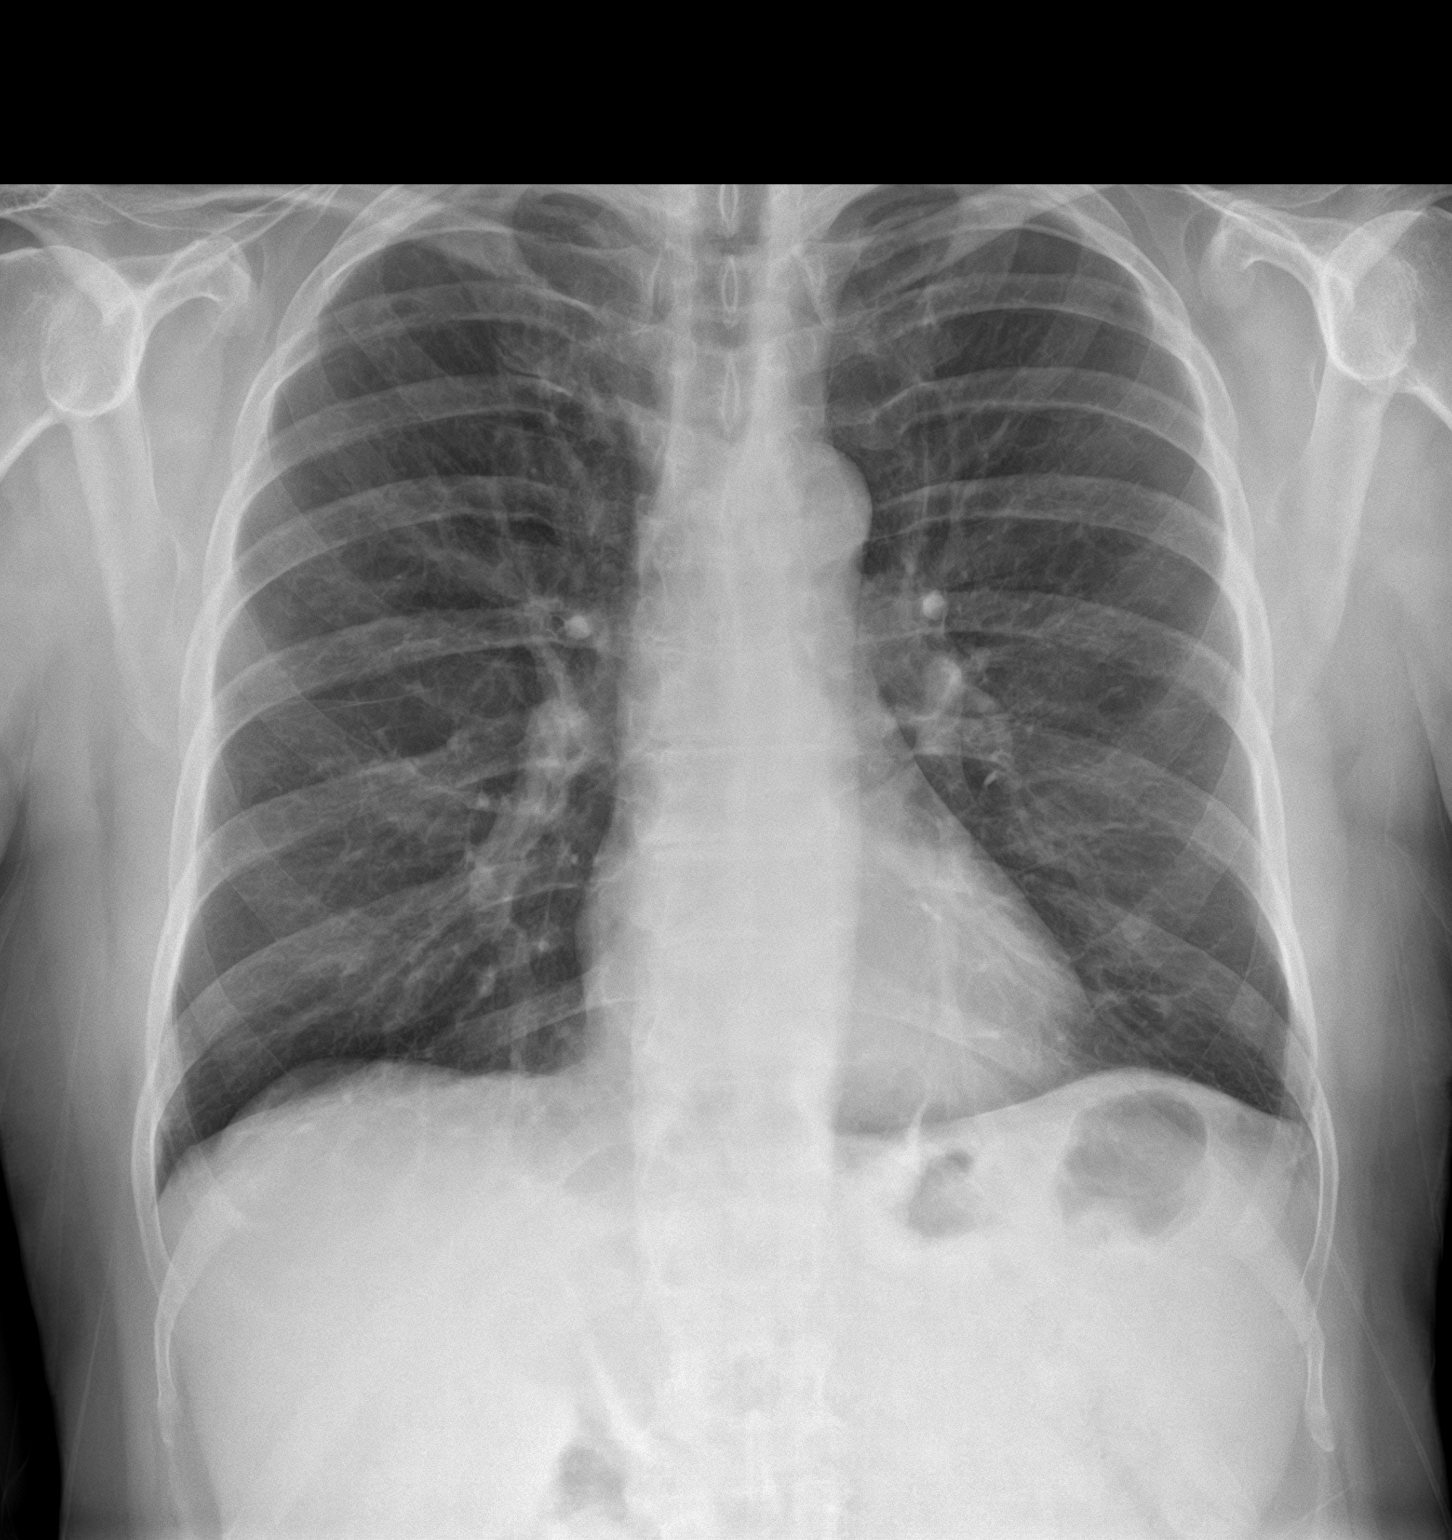

[chest lat]
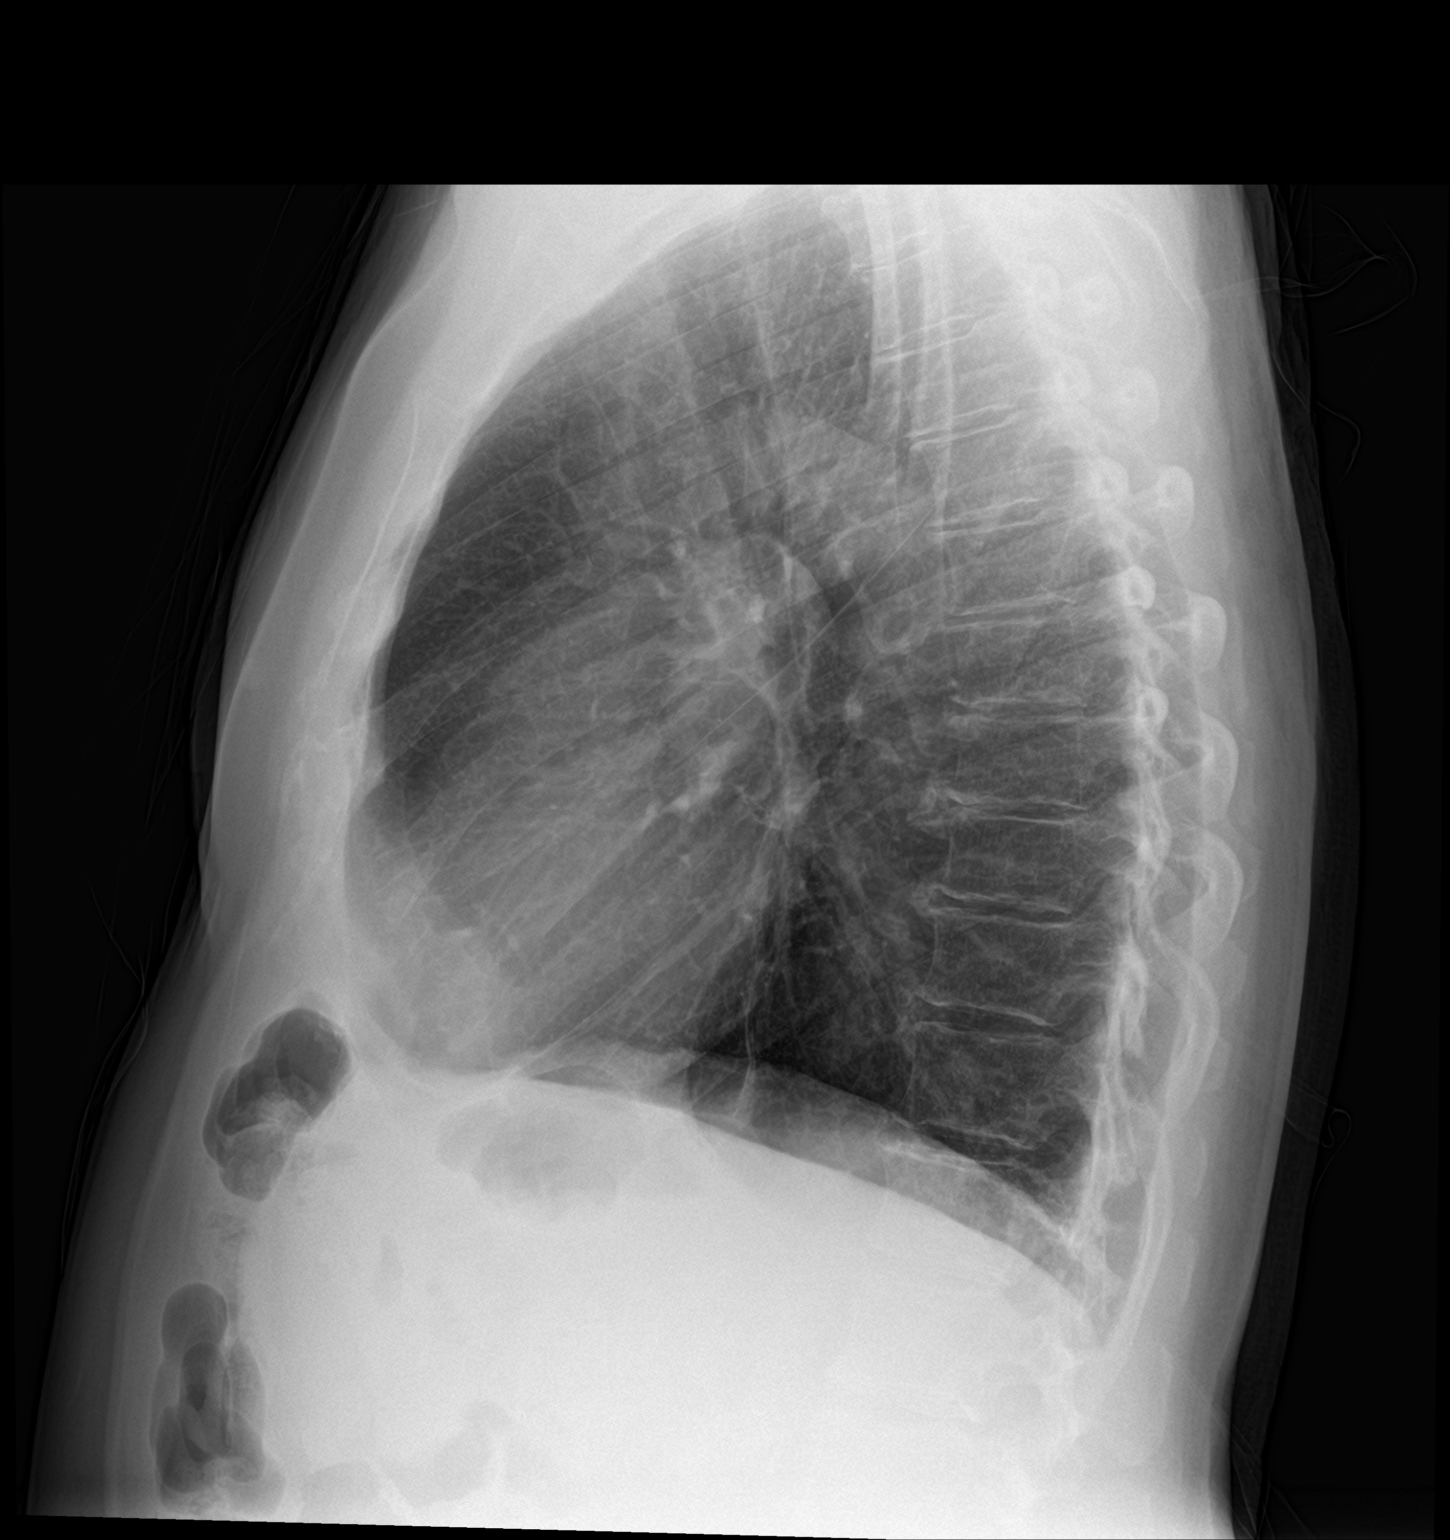

[2 of 2 positions shown; findings below may reference images not displayed]

FINDINGS: Stable heart size and mediastinal contours are within normal limits.
Both lungs are clear. The visualized skeletal structures are
unremarkable.
IMPRESSION: No acute pulmonary process identified.

## 2020-01-24 ENCOUNTER — Telehealth: Payer: Self-pay

## 2020-01-24 NOTE — Telephone Encounter (Signed)
Called lmom informing patient of appointment on 01/28/2020. klh

## 2020-01-28 ENCOUNTER — Other Ambulatory Visit: Payer: Self-pay

## 2020-01-28 ENCOUNTER — Ambulatory Visit (INDEPENDENT_AMBULATORY_CARE_PROVIDER_SITE_OTHER): Payer: PPO | Admitting: Internal Medicine

## 2020-01-28 ENCOUNTER — Encounter: Payer: Self-pay | Admitting: Internal Medicine

## 2020-01-28 VITALS — BP 164/69 | HR 63 | Temp 97.4°F | Resp 16 | Ht 67.0 in | Wt 183.1 lb

## 2020-01-28 DIAGNOSIS — J45909 Unspecified asthma, uncomplicated: Secondary | ICD-10-CM

## 2020-01-28 DIAGNOSIS — J449 Chronic obstructive pulmonary disease, unspecified: Secondary | ICD-10-CM | POA: Diagnosis not present

## 2020-01-28 DIAGNOSIS — I10 Essential (primary) hypertension: Secondary | ICD-10-CM | POA: Diagnosis not present

## 2020-01-28 DIAGNOSIS — R0602 Shortness of breath: Secondary | ICD-10-CM

## 2020-01-28 NOTE — Progress Notes (Signed)
[facility] [place], [place] [address]  Pulmonary Sleep Medicine   Office Visit Note  Patient Name: [name] DOB: 05-Sep-1951 MRN [id number]  Date of Service: 01/28/2020  Complaints/HPI: Pt is seen for pulmonary follow up.  He is doing well at this time.  He continues to use his inhalers without difficulty.  He currently uses Spiriva and Symbicort for maintenance.  He also has a Programme researcher, broadcasting/film/video inhaler. He sleeping with oxygen at night on 2 lpm.  His blood pressure is slightly elevated today, 164/69.  Denies Chest pain, Shortness of breath, palpitations, headache, or blurred vision.   ROS  General: (-) fever, (-) chills, (-) night sweats, (-) weakness Skin: (-) rashes, (-) itching,. Eyes: (-) visual changes, (-) redness, (-) itching. Nose and Sinuses: (-) nasal stuffiness or itchiness, (-) postnasal drip, (-) nosebleeds, (-) sinus trouble. Mouth and Throat: (-) sore throat, (-) hoarseness. Neck: (-) swollen glands, (-) enlarged thyroid, (-) neck pain. Respiratory: - cough, (-) bloody sputum, - shortness of breath, - wheezing. Cardiovascular: - ankle swelling, (-) chest pain. Lymphatic: (-) lymph node enlargement. Neurologic: (-) numbness, (-) tingling. Psychiatric: (-) anxiety, (-) depression   Current Medication: Outpatient Encounter Medications as of 01/28/2020  Medication Sig  . albuterol (PROVENTIL) (2.5 MG/3ML) 0.083% nebulizer solution Take 3 mLs (2.5 mg total) by nebulization every 6 (six) hours as needed for wheezing or shortness of breath.  . budesonide-formoterol (SYMBICORT) 160-4.5 MCG/ACT inhaler TAKE 2 PUFFS BY MOUTH TWICE A DAY  . meloxicam (MOBIC) 15 MG tablet meloxicam 15 mg tablet  TAKE 1 TABLET BY MOUTH EVERY DAY  . PROAIR HFA 108 (90 Base) MCG/ACT inhaler INHALE 2 PUFFS BY MOUTH EVERY 4 TO 6 HOURS AS NEEDED  . SPIRIVA HANDIHALER 18 MCG inhalation capsule INHALE 1 CAPSULE VIA HANDIHALER ONCE DAILY AT THE SAME TIME EVERY DAY   No  facility-administered encounter medications on file as of 01/28/2020.    Surgical History: History reviewed. No pertinent surgical history.  Medical History: Past Medical History:  Diagnosis Date  . Anxiety   . Asthma   . COPD (chronic obstructive pulmonary disease) ([place])   . Environmental allergies   . Sinusitis     Family History: Family History  Problem Relation Age of Onset  . Colon cancer Mother   . Hypertension Father     Social History: Social History   Socioeconomic History  . Marital status: Single    Spouse name: Not on file  . Number of children: Not on file  . Years of education: Not on file  . Highest education level: Not on file  Occupational History  . Not on file  Tobacco Use  . Smoking status: Former Research scientist (life sciences)  . Smokeless tobacco: Never Used  Substance and Sexual Activity  . Alcohol use: Not Currently    Comment: occational  . Drug use: No  . Sexual activity: Not on file  Other Topics Concern  . Not on file  Social History Narrative  . Not on file   Social Determinants of Health   Financial Resource Strain:   . Difficulty of Paying Living Expenses: Not on file  Food Insecurity:   . Worried About Charity fundraiser in the Last Year: Not on file  . Ran Out of Food in the Last Year: Not on file  Transportation Needs:   . Lack of Transportation (Medical): Not on file  . Lack of Transportation (Non-Medical): Not on file  Physical Activity:   .  Days of Exercise per Week: Not on file  . Minutes of Exercise per Session: Not on file  Stress:   . Feeling of Stress : Not on file  Social Connections:   . Frequency of Communication with Friends and Family: Not on file  . Frequency of Social Gatherings with Friends and Family: Not on file  . Attends Religious Services: Not on file  . Active Member of Clubs or Organizations: Not on file  . Attends Archivist Meetings: Not on file  . Marital Status: Not on file  Intimate Partner Violence:    . Fear of Current or Ex-Partner: Not on file  . Emotionally Abused: Not on file  . Physically Abused: Not on file  . Sexually Abused: Not on file    Vital Signs: Blood pressure (!) 164/69, pulse 63, temperature (!) 97.4 F (36.3 C), resp. rate 16, height 5\' 7"  (1.702 m), weight 183 lb 1.6 oz (83.1 kg), SpO2 97 %.  Examination: General Appearance: The patient is well-developed, well-nourished, and in no distress. Skin: Gross inspection of skin unremarkable. Head: normocephalic, no gross deformities. Eyes: no gross deformities noted. ENT: ears appear grossly normal no exudates. Neck: Supple. No thyromegaly. No LAD. Respiratory: clear bilaterally. Cardiovascular: Normal S1 and S2 without murmur or rub. Extremities: No cyanosis. pulses are equal. Neurologic: Alert and oriented. No involuntary movements.  LABS: No results found for this or any previous visit (from the past 2160 hour(s)).  Radiology: DG Chest 2 View  Result Date: 02/08/2019 CLINICAL DATA:  69 y/o  M; chest pain and productive cough. EXAM: CHEST - 2 VIEW COMPARISON:  07/14/2018 chest radiograph FINDINGS: Stable heart size and mediastinal contours are within normal limits. Both lungs are clear. The visualized skeletal structures are unremarkable. IMPRESSION: No acute pulmonary process identified. Electronically Signed   By: Kristine Garbe M.D.   On: 02/08/2019 15:07    No results found.  No results found.    Assessment and Plan: Patient Active Problem List   Diagnosis Date Noted  . COPD (chronic obstructive pulmonary disease) ([place]) 01/10/2018  . Emphysema, unspecified ([place]) 01/10/2018  . Shortness of breath 01/10/2018  . Prostate cancer ([place]) 12/21/2016  . Elevated PSA 12/02/2016    1. Asthma due to environmental allergies Continue to use inhalers as prescribed.   2. Chronic obstructive pulmonary disease, unspecified COPD type ([place]) Stable, continue present management.   3. SOB (shortness of  breath) FEV1 is 1.2 which is 41% of pre-predicted value. - Spirometry with Graph  4. Essential hypertension Slightly elevated, managed by pcp.  General Counseling: I have discussed the findings of the evaluation and examination with [name].  I have also discussed any further diagnostic evaluation thatmay be needed or ordered today. Fernanda verbalizes understanding of the findings of todays visit. We also reviewed his medications today and discussed drug interactions and side effects including but not limited excessive drowsiness and altered mental states. We also discussed that there is always a risk not just to him but also people around him. he has been encouraged to call the office with any questions or concerns that should arise related to todays visit.  Orders Placed This Encounter  Procedures  . Spirometry with Graph    Order Specific Question:   Where should this test be performed?    Answer:   [facility]  . Pulmonary Function Test    Standing Status:   Future    Standing Expiration Date:   01/27/2021  Order Specific Question:   Where should this test be performed?    Answer:   [facility]     Time spent: 30 includes 10 minutes for chart review This patient was seen by Orson Gear AGNP-C in Collaboration with Dr. Devona Konig as a part of collaborative care agreement.   I have personally obtained a history, examined the patient, evaluated laboratory and imaging results, formulated the assessment and plan and placed orders.    Allyne Gee, MD [facility] Pulmonary and Critical Care Sleep medicine

## 2020-02-07 ENCOUNTER — Ambulatory Visit: Payer: PPO | Attending: Internal Medicine

## 2020-02-07 DIAGNOSIS — Z23 Encounter for immunization: Secondary | ICD-10-CM | POA: Insufficient documentation

## 2020-02-07 NOTE — Progress Notes (Signed)
   Covid-19 Vaccination Clinic  Name:  [name]    MRN: [id number] DOB: 07-18-1951  02/07/2020  [name] was observed post Covid-19 immunization for 15 minutes without incidence. He was provided with Vaccine Information Sheet and instruction to access the V-Safe system.   [name] was instructed to call 911 with any severe reactions post vaccine: Marland Kitchen Difficulty breathing  . Swelling of your face and throat  . A fast heartbeat  . A bad rash all over your body  . Dizziness and weakness    Immunizations Administered    Name Date Dose VIS Date Route   Pfizer COVID-19 Vaccine 02/07/2020  9:41 AM 0.3 mL 11/23/2019 Intramuscular   Manufacturer: [place]   Lot: [id number]   [place]: [id number]

## 2020-02-18 ENCOUNTER — Telehealth: Payer: Self-pay

## 2020-02-18 NOTE — Telephone Encounter (Signed)
Confirmed appointment on 02/20/2020 and screened for covid. klh

## 2020-02-20 ENCOUNTER — Other Ambulatory Visit: Payer: Self-pay

## 2020-02-20 ENCOUNTER — Ambulatory Visit: Payer: PPO | Admitting: Internal Medicine

## 2020-02-20 DIAGNOSIS — R0602 Shortness of breath: Secondary | ICD-10-CM | POA: Diagnosis not present

## 2020-02-20 LAB — PULMONARY FUNCTION TEST

## 2020-02-24 NOTE — Procedures (Signed)
[facility] [place], [address]  DATE OF SERVICE: February 20, 2020  Complete Pulmonary Function Testing Interpretation:  FINDINGS:  Forced vital capacity is moderately decreased.  FEV1 is 1.10 L and is 37% of predicted which is severely decreased.  FEV1 FVC ratio is severely decreased.  Postbronchodilator there is no significant improvement in FEV1 however clinical improvement may still occur in absence of spirometric improvement.  Total capacity is normal residual volume is increased residual in total lung capacity ratio is increased.  FRC is normal.  DLCO is severely decreased.  DLCO corrected for alveolar volume is decreased.  IMPRESSION:  This pulmonary function study is consistent with severe obstructive lung disease.  No response to bronchodilators clinical correlation recommended  Allyne Gee, MD [facility] Pulmonary Critical Care Medicine Sleep Medicine

## 2020-02-26 ENCOUNTER — Telehealth: Payer: Self-pay

## 2020-02-26 NOTE — Telephone Encounter (Signed)
Faxed order to [place] home[facility] for overnight oximetry test and service concentrator. beth

## 2020-03-04 ENCOUNTER — Ambulatory Visit: Payer: PPO | Attending: Internal Medicine

## 2020-03-04 DIAGNOSIS — Z23 Encounter for immunization: Secondary | ICD-10-CM

## 2020-03-04 NOTE — Progress Notes (Signed)
   Covid-19 Vaccination Clinic  Name:  [name]    MRN: [id number] DOB: [birth year]  03/04/2020  [name] was observed post Covid-19 immunization for 15 minutes without incident. He was provided with Vaccine Information Sheet and instruction to access the V-Safe system.   [name] was instructed to call 911 with any severe reactions post vaccine: Marland Kitchen Difficulty breathing  . Swelling of face and throat  . A fast heartbeat  . A bad rash all over body  . Dizziness and weakness   Immunizations Administered    Name Date Dose VIS Date Route   Pfizer COVID-19 Vaccine 03/04/2020  9:27 AM 0.3 mL 11/23/2019 Intramuscular   Manufacturer: [place]   Lot: [id number]   [place]: [id number]

## 2020-03-06 ENCOUNTER — Telehealth: Payer: Self-pay

## 2020-03-06 NOTE — Telephone Encounter (Signed)
Called lmom/no vm informing patient of appointment on 03/10/2020. klh

## 2020-03-10 ENCOUNTER — Other Ambulatory Visit: Payer: Self-pay

## 2020-03-10 ENCOUNTER — Encounter: Payer: Self-pay | Admitting: Internal Medicine

## 2020-03-10 ENCOUNTER — Telehealth: Payer: Self-pay

## 2020-03-10 ENCOUNTER — Ambulatory Visit (INDEPENDENT_AMBULATORY_CARE_PROVIDER_SITE_OTHER): Payer: PPO | Admitting: Internal Medicine

## 2020-03-10 VITALS — BP 188/86 | HR 75 | Temp 97.3°F | Resp 16 | Ht 67.0 in | Wt 181.0 lb

## 2020-03-10 DIAGNOSIS — J449 Chronic obstructive pulmonary disease, unspecified: Secondary | ICD-10-CM

## 2020-03-10 DIAGNOSIS — G4734 Idiopathic sleep related nonobstructive alveolar hypoventilation: Secondary | ICD-10-CM | POA: Diagnosis not present

## 2020-03-10 DIAGNOSIS — J45909 Unspecified asthma, uncomplicated: Secondary | ICD-10-CM

## 2020-03-10 DIAGNOSIS — I10 Essential (primary) hypertension: Secondary | ICD-10-CM | POA: Diagnosis not present

## 2020-03-10 MED ORDER — ALBUTEROL SULFATE HFA 108 (90 BASE) MCG/ACT IN AERS: INHALATION_SPRAY | RESPIRATORY_TRACT | 3 refills | Status: DC

## 2020-03-10 MED ORDER — BUDESONIDE-FORMOTEROL FUMARATE 160-4.5 MCG/ACT IN AERO: INHALATION_SPRAY | RESPIRATORY_TRACT | 4 refills | Status: DC

## 2020-03-10 NOTE — Telephone Encounter (Signed)
Faxed and put copy in [place] homepatient folder for O2 concentrator supplies and service. Beth

## 2020-03-10 NOTE — Progress Notes (Signed)
[facility] [place], [place] [address]  Pulmonary Sleep Medicine   Office Visit Note  Patient Name: [name] DOB: 1951/02/26 MRN [id number]  Date of Service: 03/10/2020  Complaints/HPI: Patient is here today to review results of overnight oximetry. He has been on oxygen at night in the past but stopped because the machine was broken. Repeated overnight oximetry to ensure he still needed oxygen at night. Results show 103.5 minutes with SpO2 levels below 88% and 143.7 minutes with SpO2 below 89%. Average HR 66, lowest HR recorded 54 beats per minute. Will order new oxygen concentrator for overnight for him to wear 2LPM via [place] at night. He reports that when he was able to wear oxygen at night he would have much more energy during the day. He has noticed feeling fatigued during the day without wearing his oxygen. Denies shortness of breath, cough or chest pain.  ROS  General: (-) fever, (-) chills, (-) night sweats, (-) weakness Skin: (-) rashes, (-) itching,. Eyes: (-) visual changes, (-) redness, (-) itching. Nose and Sinuses: (-) nasal stuffiness or itchiness, (-) postnasal drip, (-) nosebleeds, (-) sinus trouble. Mouth and Throat: (-) sore throat, (-) hoarseness. Neck: (-) swollen glands, (-) enlarged thyroid, (-) neck pain. Respiratory: - cough, (-) bloody sputum, - shortness of breath, - wheezing. Cardiovascular: - ankle swelling, (-) chest pain. Lymphatic: (-) lymph node enlargement. Neurologic: (-) numbness, (-) tingling. Psychiatric: (-) anxiety, (-) depression   Current Medication: Outpatient Encounter Medications as of 03/10/2020  Medication Sig  . albuterol (PROAIR HFA) 108 (90 Base) MCG/ACT inhaler INHALE 2 PUFFS BY MOUTH EVERY 4 TO 6 HOURS AS NEEDED  . albuterol (PROVENTIL) (2.5 MG/3ML) 0.083% nebulizer solution Take 3 mLs (2.5 mg total) by nebulization every 6 (six) hours as needed for wheezing or shortness of breath.  .  budesonide-formoterol (SYMBICORT) 160-4.5 MCG/ACT inhaler TAKE 2 PUFFS BY MOUTH TWICE A DAY  . meloxicam (MOBIC) 15 MG tablet meloxicam 15 mg tablet  TAKE 1 TABLET BY MOUTH EVERY DAY  . SPIRIVA HANDIHALER 18 MCG inhalation capsule INHALE 1 CAPSULE VIA HANDIHALER ONCE DAILY AT THE SAME TIME EVERY DAY  . [DISCONTINUED] budesonide-formoterol (SYMBICORT) 160-4.5 MCG/ACT inhaler TAKE 2 PUFFS BY MOUTH TWICE A DAY  . [DISCONTINUED] PROAIR HFA 108 (90 Base) MCG/ACT inhaler INHALE 2 PUFFS BY MOUTH EVERY 4 TO 6 HOURS AS NEEDED   No facility-administered encounter medications on file as of 03/10/2020.    Surgical History: History reviewed. No pertinent surgical history.  Medical History: Past Medical History:  Diagnosis Date  . Anxiety   . Asthma   . COPD (chronic obstructive pulmonary disease) ([place])   . Environmental allergies   . Sinusitis     Family History: Family History  Problem Relation Age of Onset  . Colon cancer Mother   . Hypertension Father     Social History: Social History   Socioeconomic History  . Marital status: Single    Spouse name: Not on file  . Number of children: Not on file  . Years of education: Not on file  . Highest education level: Not on file  Occupational History  . Not on file  Tobacco Use  . Smoking status: Former Research scientist (life sciences)  . Smokeless tobacco: Never Used  Substance and Sexual Activity  . Alcohol use: Not Currently    Comment: occational  . Drug use: No  . Sexual activity: Not on file  Other Topics Concern  . Not on file  Social  History Narrative  . Not on file   Social Determinants of Health   Financial Resource Strain:   . Difficulty of Paying Living Expenses:   Food Insecurity:   . Worried About Charity fundraiser in the Last Year:   . Arboriculturist in the Last Year:   Transportation Needs:   . Film/video editor (Medical):   Marland Kitchen Lack of Transportation (Non-Medical):   Physical Activity:   . Days of Exercise per Week:   .  Minutes of Exercise per Session:   Stress:   . Feeling of Stress :   Social Connections:   . Frequency of Communication with Friends and Family:   . Frequency of Social Gatherings with Friends and Family:   . Attends Religious Services:   . Active Member of Clubs or Organizations:   . Attends Archivist Meetings:   Marland Kitchen Marital Status:   Intimate Partner Violence:   . Fear of Current or Ex-Partner:   . Emotionally Abused:   Marland Kitchen Physically Abused:   . Sexually Abused:     Vital Signs: Blood pressure (!) 188/86, pulse 75, temperature (!) 97.3 F (36.3 C), resp. rate 16, height 5\' 7"  (1.702 m), weight 181 lb (82.1 kg), SpO2 98 %.  Examination: General Appearance: The patient is well-developed, well-nourished, and in no distress. Skin: Gross inspection of skin unremarkable. Head: normocephalic, no gross deformities. Eyes: no gross deformities noted. ENT: ears appear grossly normal no exudates. Neck: Supple. No thyromegaly. No LAD. Respiratory: Diminished lung sounds bilaterally. Cardiovascular: Normal S1 and S2 without murmur or rub. Extremities: No cyanosis. pulses are equal. Neurologic: Alert and oriented. No involuntary movements.  LABS: No results found for this or any previous visit (from the past 2160 hour(s)).  Radiology: DG Chest 2 View  Result Date: 02/08/2019 CLINICAL DATA:  69 y/o  M; chest pain and productive cough. EXAM: CHEST - 2 VIEW COMPARISON:  07/14/2018 chest radiograph FINDINGS: Stable heart size and mediastinal contours are within normal limits. Both lungs are clear. The visualized skeletal structures are unremarkable. IMPRESSION: No acute pulmonary process identified. Electronically Signed   By: Kristine Garbe M.D.   On: 02/08/2019 15:07    No results found.  No results found.    Assessment and Plan: Patient Active Problem List   Diagnosis Date Noted  . COPD (chronic obstructive pulmonary disease) ([place]) 01/10/2018  . Emphysema,  unspecified ([place]) 01/10/2018  . Shortness of breath 01/10/2018  . Prostate cancer ([place]) 12/21/2016  . Elevated PSA 12/02/2016    1. Nocturnal hypoxia Ordered oxygen for nocturnal hypoxia. - For home use only DME oxygen  2. Chronic obstructive pulmonary disease, unspecified COPD type ([place]) Refilled pro-air and symbicort, stable at this time on current therapy. Will continue to monitor. - albuterol (PROAIR HFA) 108 (90 Base) MCG/ACT inhaler; INHALE 2 PUFFS BY MOUTH EVERY 4 TO 6 HOURS AS NEEDED  Dispense: 8.5 g; Refill: 3 -symbicort refilled  3. Asthma due to environmental allergies Stable at this time, continue inhalers as directed.  4. Essential hypertension Slightly elevated today, encouraged patient to take medications as directed and follow-up with PCP as appropriate  General Counseling: I have discussed the findings of the evaluation and examination with [name].  I have also discussed any further diagnostic evaluation thatmay be needed or ordered today. Hari verbalizes understanding of the findings of todays visit. We also reviewed his medications today and discussed drug interactions and side effects including but not limited excessive drowsiness and  altered mental states. We also discussed that there is always a risk not just to him but also people around him. he has been encouraged to call the office with any questions or concerns that should arise related to todays visit.  Orders Placed This Encounter  Procedures  . For home use only DME oxygen    Order Specific Question:   Length of Need    Answer:   Lifetime    Order Specific Question:   Oxygen delivery system    Answer:   Gas     Time spent: 25 This patient was seen by Orson Gear AGNP-C in Collaboration with Dr. Devona Konig as a part of collaborative care agreement.  I have personally obtained a history, examined the patient, evaluated laboratory and imaging results, formulated the assessment and plan and placed  orders.    Allyne Gee, MD [facility] Pulmonary and Critical Care Sleep medicine

## 2020-03-11 ENCOUNTER — Other Ambulatory Visit: Payer: Self-pay | Admitting: Internal Medicine

## 2020-03-12 DIAGNOSIS — J449 Chronic obstructive pulmonary disease, unspecified: Secondary | ICD-10-CM | POA: Diagnosis not present

## 2020-04-11 DIAGNOSIS — J449 Chronic obstructive pulmonary disease, unspecified: Secondary | ICD-10-CM | POA: Diagnosis not present

## 2020-05-12 DIAGNOSIS — J449 Chronic obstructive pulmonary disease, unspecified: Secondary | ICD-10-CM | POA: Diagnosis not present

## 2020-05-26 ENCOUNTER — Ambulatory Visit: Payer: PPO | Admitting: Internal Medicine

## 2020-05-26 DIAGNOSIS — W57XXXA Bitten or stung by nonvenomous insect and other nonvenomous arthropods, initial encounter: Secondary | ICD-10-CM | POA: Diagnosis not present

## 2020-05-26 DIAGNOSIS — S70362A Insect bite (nonvenomous), left thigh, initial encounter: Secondary | ICD-10-CM | POA: Diagnosis not present

## 2020-06-11 DIAGNOSIS — J449 Chronic obstructive pulmonary disease, unspecified: Secondary | ICD-10-CM | POA: Diagnosis not present

## 2020-06-17 ENCOUNTER — Telehealth: Payer: Self-pay

## 2020-06-17 NOTE — Telephone Encounter (Signed)
Confirmed patient appointment 

## 2020-06-19 ENCOUNTER — Encounter: Payer: Self-pay | Admitting: Internal Medicine

## 2020-06-19 ENCOUNTER — Ambulatory Visit: Payer: PPO | Admitting: Internal Medicine

## 2020-06-19 ENCOUNTER — Other Ambulatory Visit: Payer: Self-pay

## 2020-06-19 VITALS — BP 148/72 | HR 80 | Temp 97.6°F | Resp 16 | Ht 67.0 in | Wt 183.0 lb

## 2020-06-19 DIAGNOSIS — I10 Essential (primary) hypertension: Secondary | ICD-10-CM

## 2020-06-19 DIAGNOSIS — J45909 Unspecified asthma, uncomplicated: Secondary | ICD-10-CM

## 2020-06-19 DIAGNOSIS — J449 Chronic obstructive pulmonary disease, unspecified: Secondary | ICD-10-CM | POA: Diagnosis not present

## 2020-06-19 DIAGNOSIS — G4734 Idiopathic sleep related nonobstructive alveolar hypoventilation: Secondary | ICD-10-CM | POA: Diagnosis not present

## 2020-06-19 NOTE — Progress Notes (Signed)
[facility] [place], [place] [address]  Pulmonary Sleep Medicine   Office Visit Note  Patient Name: [name] DOB: [birth year] MRN [id number]  Date of Service: 06/19/2020  Complaints/HPI: Pt is here for pulmonary follow up. Since beginning of April he has been using his new oxygen machine at night.  He uses 2 lpm and reports he feels better now that he has the machine.  He does not feel as good as before the machine broke, but he is hopeful that he will get back to that level with time.    He continues to use Spiriva and Symbicort daily as directed.  He has good results using this.  He also uses his albuterol rescue inhaler when he is doing work.  This is dependent up the weather, and how much activity he is doing.  ROS  General: (-) fever, (-) chills, (-) night sweats, (-) weakness Skin: (-) rashes, (-) itching,. Eyes: (-) visual changes, (-) redness, (-) itching. Nose and Sinuses: (-) nasal stuffiness or itchiness, (-) postnasal drip, (-) nosebleeds, (-) sinus trouble. Mouth and Throat: (-) sore throat, (-) hoarseness. Neck: (-) swollen glands, (-) enlarged thyroid, (-) neck pain. Respiratory: - cough, (-) bloody sputum, + shortness of breath, - wheezing. Cardiovascular: - ankle swelling, (-) chest pain. Lymphatic: (-) lymph node enlargement. Neurologic: (-) numbness, (-) tingling. Psychiatric: (-) anxiety, (-) depression   Current Medication: Outpatient Encounter Medications as of 06/19/2020  Medication Sig   albuterol (PROAIR HFA) 108 (90 Base) MCG/ACT inhaler INHALE 2 PUFFS BY MOUTH EVERY 4 TO 6 HOURS AS NEEDED   albuterol (PROVENTIL) (2.5 MG/3ML) 0.083% nebulizer solution Take 3 mLs (2.5 mg total) by nebulization every 6 (six) hours as needed for wheezing or shortness of breath.   budesonide-formoterol (SYMBICORT) 160-4.5 MCG/ACT inhaler TAKE 2 PUFFS BY MOUTH TWICE A DAY   meloxicam (MOBIC) 15 MG tablet meloxicam 15 mg tablet  TAKE 1 TABLET  BY MOUTH EVERY DAY   SPIRIVA HANDIHALER 18 MCG inhalation capsule INHALE 1 CAPSULE VIA HANDIHALER ONCE DAILY AT THE SAME TIME EVERY DAY   No facility-administered encounter medications on file as of 06/19/2020.    Surgical History: History reviewed. No pertinent surgical history.  Medical History: Past Medical History:  Diagnosis Date   Anxiety    Asthma    COPD (chronic obstructive pulmonary disease) ([place])    Environmental allergies    Sinusitis     Family History: Family History  Problem Relation Age of Onset   Colon cancer Mother    Hypertension Father     Social History: Social History   Socioeconomic History   Marital status: Single    Spouse name: Not on file   Number of children: Not on file   Years of education: Not on file   Highest education level: Not on file  Occupational History   Not on file  Tobacco Use   Smoking status: Former Smoker   Smokeless tobacco: Never Used  Substance and Sexual Activity   Alcohol use: Not Currently    Comment: occational   Drug use: No   Sexual activity: Not on file  Other Topics Concern   Not on file  Social History Narrative   Not on file   Social Determinants of Health   Financial Resource Strain:    Difficulty of Paying Living Expenses:   Food Insecurity:    Worried About [place] in the Last Year:    [employer] of  Food in the Last Year:   Transportation Needs:    Film/video editor (Medical):    Lack of Transportation (Non-Medical):   Physical Activity:    Days of Exercise per Week:    Minutes of Exercise per Session:   Stress:    Feeling of Stress :   Social Connections:    Frequency of Communication with Friends and Family:    Frequency of Social Gatherings with Friends and Family:    Attends Religious Services:    Active Member of Clubs or Organizations:    Attends Music therapist:    Marital Status:   Intimate Partner Violence:    Fear  of Current or Ex-Partner:    Emotionally Abused:    Physically Abused:    Sexually Abused:     Vital Signs: Blood pressure (!) 161/78, pulse 80, temperature 97.6 F (36.4 C), resp. rate 16, height 5\' 7"  (1.702 m), weight 183 lb (83 kg), SpO2 95 %.  Examination: General Appearance: The patient is well-developed, well-nourished, and in no distress. Skin: Gross inspection of skin unremarkable. Head: normocephalic, no gross deformities. Eyes: no gross deformities noted. ENT: ears appear grossly normal no exudates. Neck: Supple. No thyromegaly. No LAD. Respiratory: clear bilaterally. Cardiovascular: Normal S1 and S2 without murmur or rub. Extremities: No cyanosis. pulses are equal. Neurologic: Alert and oriented. No involuntary movements.  LABS: No results found for this or any previous visit (from the past 2160 hour(s)).  Radiology: DG Chest 2 View  Result Date: 02/08/2019 CLINICAL DATA:  69 y/o  M; chest pain and productive cough. EXAM: CHEST - 2 VIEW COMPARISON:  07/14/2018 chest radiograph FINDINGS: Stable heart size and mediastinal contours are within normal limits. Both lungs are clear. The visualized skeletal structures are unremarkable. IMPRESSION: No acute pulmonary process identified. Electronically Signed   By: Kristine Garbe M.D.   On: 02/08/2019 15:07    No results found.  No results found.    Assessment and Plan: Patient Active Problem List   Diagnosis Date Noted   COPD (chronic obstructive pulmonary disease) ([place]) 01/10/2018   Emphysema, unspecified ([place]) 01/10/2018   Shortness of breath 01/10/2018   Prostate cancer ([place]) 12/21/2016   Elevated PSA 12/02/2016    1. Chronic obstructive pulmonary disease, unspecified COPD type ([place]) Stable, continue to use inhalers and oxygen as prescribe.d.    2. Nocturnal hypoxia Continue to use oxygen at night as prescribed.   3. Asthma due to environmental allergies Continue to use inhalers as  directed.   4. Essential hypertension Continue to monitor blood pressure.  General Counseling: I have discussed the findings of the evaluation and examination with [name].  I have also discussed any further diagnostic evaluation thatmay be needed or ordered today. [name] verbalizes understanding of the findings of todays visit. We also reviewed his medications today and discussed drug interactions and side effects including but not limited excessive drowsiness and altered mental states. We also discussed that there is always a risk not just to him but also people around him. he has been encouraged to call the office with any questions or concerns that should arise related to todays visit.  No orders of the defined types were placed in this encounter.    Time spent: 30 This patient was seen by Orson Gear AGNP-C in Collaboration with Dr. Devona Konig as a part of collaborative care agreement.   I have personally obtained a history, examined the patient, evaluated laboratory and imaging results, formulated the assessment and  plan and placed orders.    Allyne Gee, MD [facility] Pulmonary and Critical Care Sleep medicine

## 2020-07-03 ENCOUNTER — Other Ambulatory Visit: Payer: Self-pay | Admitting: Adult Health

## 2020-07-04 ENCOUNTER — Other Ambulatory Visit: Payer: Self-pay

## 2020-07-04 DIAGNOSIS — J449 Chronic obstructive pulmonary disease, unspecified: Secondary | ICD-10-CM

## 2020-07-04 MED ORDER — BUDESONIDE-FORMOTEROL FUMARATE 160-4.5 MCG/ACT IN AERO: INHALATION_SPRAY | RESPIRATORY_TRACT | 4 refills | Status: DC

## 2020-07-12 DIAGNOSIS — J449 Chronic obstructive pulmonary disease, unspecified: Secondary | ICD-10-CM | POA: Diagnosis not present

## 2020-08-12 DIAGNOSIS — J449 Chronic obstructive pulmonary disease, unspecified: Secondary | ICD-10-CM | POA: Diagnosis not present

## 2020-08-25 ENCOUNTER — Other Ambulatory Visit: Payer: Self-pay | Admitting: Adult Health

## 2020-08-26 ENCOUNTER — Other Ambulatory Visit: Payer: Self-pay | Admitting: Adult Health

## 2020-08-26 DIAGNOSIS — J449 Chronic obstructive pulmonary disease, unspecified: Secondary | ICD-10-CM

## 2020-09-11 DIAGNOSIS — J449 Chronic obstructive pulmonary disease, unspecified: Secondary | ICD-10-CM | POA: Diagnosis not present

## 2020-09-18 ENCOUNTER — Ambulatory Visit: Payer: PPO | Admitting: Internal Medicine

## 2020-09-18 ENCOUNTER — Encounter: Payer: Self-pay | Admitting: Internal Medicine

## 2020-09-18 DIAGNOSIS — Z122 Encounter for screening for malignant neoplasm of respiratory organs: Secondary | ICD-10-CM

## 2020-09-18 DIAGNOSIS — J449 Chronic obstructive pulmonary disease, unspecified: Secondary | ICD-10-CM | POA: Diagnosis not present

## 2020-09-18 DIAGNOSIS — R0602 Shortness of breath: Secondary | ICD-10-CM

## 2020-09-18 MED ORDER — BUDESONIDE-FORMOTEROL FUMARATE 160-4.5 MCG/ACT IN AERO: INHALATION_SPRAY | RESPIRATORY_TRACT | 3 refills | Status: DC

## 2020-09-18 NOTE — Progress Notes (Signed)
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 Eyes: (-) visual changes, (-) redness, (-) itching. Nose and Sinuses: (-) nasal stuffiness or itchiness, (-) postnasal drip, (-) nosebleeds, (-) sinus trouble. Mouth and Throat: (-) sore throat, (-) hoarseness. Neck: (-) swollen glands, (-) enlarged thyroid, (-) neck pain. Respiratory: + cough, (-) bloody sputum, + shortness of breath, + wheezing. Cardiovascular: - ankle swelling, (-) chest pain. Lymphatic: (-) lymph node enlargement. Neurologic: (-) numbness, (-) tingling. Psychiatric: (-) anxiety, (-) depression   Current Medication: Outpatient Encounter Medications as of 09/18/2020  Medication Sig  . albuterol (PROVENTIL) (2.5 MG/3ML) 0.083% nebulizer solution Take 3 mLs (2.5 mg total) by nebulization every 6 (six) hours as needed for wheezing or shortness of breath.  Marland Kitchen albuterol (VENTOLIN HFA) 108 (90 Base) MCG/ACT inhaler TAKE 2 PUFFS BY MOUTH EVERY 4 TO 6 HOURS AS NEEDED  . budesonide-formoterol (SYMBICORT) 160-4.5 MCG/ACT inhaler TAKE 2 PUFFS BY MOUTH TWICE A DAY  . meloxicam (MOBIC) 15 MG tablet meloxicam 15 mg tablet  TAKE 1 TABLET BY MOUTH EVERY DAY  . OXYGEN Inhale into the lungs. 2 litre at night AHP  . SPIRIVA HANDIHALER 18 MCG inhalation capsule INHALE 1 CAPSULE VIA HANDIHALER ONCE DAILY AT THE SAME TIME EVERY  DAY  . [DISCONTINUED] budesonide-formoterol (SYMBICORT) 160-4.5 MCG/ACT inhaler TAKE 2 PUFFS BY MOUTH TWICE A DAY   No facility-administered encounter medications on file as of 09/18/2020.    Surgical History: History reviewed. No pertinent surgical history.  Medical History: Past Medical History:  Diagnosis Date  . Anxiety   . Asthma   . COPD (chronic obstructive pulmonary disease) ([place])   . Environmental allergies   . Sinusitis     Family History: Family History  Problem Relation Age of Onset  . Colon cancer Mother   . Hypertension Father     Social History: Social History   Socioeconomic History  . Marital status: Single    Spouse name: Not on file  . Number of children: Not on file  . Years of education: Not on file  . Highest education level: Not on file  Occupational History  . Not on file  Tobacco Use  . Smoking status: Former Research scientist (life sciences)  . Smokeless tobacco: Never Used  Substance and Sexual Activity  . Alcohol use: Not Currently    Comment: occational  . Drug use: No  . Sexual activity: Not on file  Other Topics Concern  . Not on file  Social History Narrative  . Not on file   Social Determinants of Health   Financial Resource Strain:   . Difficulty of Paying Living Expenses: Not on file  Food Insecurity:   . Worried About Charity fundraiser in the Last Year: Not on file  . Ran Out of Food in the Last Year: Not on file  Transportation Needs:   . Lack of Transportation (Medical):  Not on file  . Lack of Transportation (Non-Medical): Not on file  Physical Activity:   . Days of Exercise per Week: Not on file  . Minutes of Exercise per Session: Not on file  Stress:   . Feeling of Stress : Not on file  Social Connections:   . Frequency of Communication with Friends and Family: Not on file  . Frequency of Social Gatherings with Friends and Family: Not on file  . Attends Religious Services: Not on file  . Active Member of Clubs or Organizations: Not on  file  . Attends Archivist Meetings: Not on file  . Marital Status: Not on file  Intimate Partner Violence:   . Fear of Current or Ex-Partner: Not on file  . Emotionally Abused: Not on file  . Physically Abused: Not on file  . Sexually Abused: Not on file    Vital Signs: Blood pressure 140/80, pulse 72, resp. rate 16, height 5\' 7"  (1.702 m), weight 184 lb (83.5 kg), SpO2 96 %.  Examination: General Appearance: The patient is well-developed, well-nourished, and in no distress. Skin: Gross inspection of skin unremarkable. Head: normocephalic, no gross deformities. Eyes: no gross deformities noted. ENT: ears appear grossly normal no exudates. Neck: Supple. No thyromegaly. No LAD. Respiratory: Diminished with prominent wheezing in bilateral bases, no rhonchi or rales noted. Cardiovascular: Normal S1 and S2 without murmur or rub. Extremities: No cyanosis. pulses are equal. Neurologic: Alert and oriented. No involuntary movements.  LABS: No results found for this or any previous visit (from the past 2160 hour(s)).  Radiology: DG Chest 2 View  Result Date: 02/08/2019 CLINICAL DATA:  69 y/o  M; chest pain and productive cough. EXAM: CHEST - 2 VIEW COMPARISON:  07/14/2018 chest radiograph FINDINGS: Stable heart size and mediastinal contours are within normal limits. Both lungs are clear. The visualized skeletal structures are unremarkable. IMPRESSION: No acute pulmonary process identified. Electronically Signed   By: Kristine Garbe M.D.   On: 02/08/2019 15:07   Assessment and Plan: Patient Active Problem List   Diagnosis Date Noted  . COPD (chronic obstructive pulmonary disease) ([place]) 01/10/2018  . Emphysema, unspecified ([place]) 01/10/2018  . Shortness of breath 01/10/2018  . Prostate cancer ([place]) 12/21/2016  . Elevated PSA 12/02/2016    1. Chronic obstructive pulmonary disease, unspecified COPD type ([place]) Requesting refills today on Symbicort PFT 02/20/20, FEV1  1.10L which is 37% predicted, severe obstructive lung disease Consider optimizing to triple therapy--at this time he did not want a prescription for a new inhaler, no samples - budesonide-formoterol (SYMBICORT) 160-4.5 MCG/ACT inhaler; TAKE 2 PUFFS BY MOUTH TWICE A DAY  Dispense: 10.2 g; Refill: 3  2. Encounter for screening for lung cancer Will review results of CT - CT CHEST LUNG CA SCREEN LOW DOSE W/O CM; Future  3. Shortness of breath Stable spirometry compared to recent PFT - Spirometry with Graph  General Counseling: I have discussed the findings of the evaluation and examination with [name].  I have also discussed any further diagnostic evaluation thatmay be needed or ordered today. [name] verbalizes understanding of the findings of todays visit. We also reviewed his medications today and discussed drug interactions and side effects including but not limited excessive drowsiness and altered mental states. We also discussed that there is always a risk not just to him but also people around him. he has been encouraged to call the office with any questions or concerns that should arise related to todays visit.  Orders Placed This  Encounter  Procedures  . CT CHEST LUNG CA SCREEN LOW DOSE W/O CM    Standing Status:   Future    Standing Expiration Date:   09/18/2021    Order Specific Question:   Reason for Exam (SYMPTOM  OR DIAGNOSIS REQUIRED)    Answer:   lung cancer screening    Order Specific Question:   Preferred Imaging Location?    Answer:   [facility]  . Spirometry with Graph    Order Specific Question:   Where should this test be performed?    Answer:   Other     Time spent: 30  I have personally obtained a history, examined the patient, evaluated laboratory and imaging results, formulated the assessment and plan and placed orders. This patient was seen by Casey Burkitt AGNP-C in Collaboration with Dr. Devona Konig as a part of collaborative care agreement.    Allyne Gee, MD [facility] Pulmonary and Critical Care Sleep medicine

## 2020-09-22 ENCOUNTER — Other Ambulatory Visit: Payer: Self-pay

## 2020-09-22 ENCOUNTER — Encounter: Payer: Self-pay | Admitting: Internal Medicine

## 2020-09-22 DIAGNOSIS — R0602 Shortness of breath: Secondary | ICD-10-CM

## 2020-09-22 NOTE — Patient Instructions (Signed)
Chronic Obstructive Pulmonary Disease Chronic obstructive pulmonary disease (COPD) is a long-term (chronic) lung problem. When you have COPD, it is hard for air to get in and out of your lungs. Usually the condition gets worse over time, and your lungs will never return to normal. There are things you can do to keep yourself as healthy as possible.  Your doctor may treat your condition with: ? Medicines. ? Oxygen. ? Lung surgery.  Your doctor may also recommend: ? Rehabilitation. This includes steps to make your body work better. It may involve a team of specialists. ? Quitting smoking, if you smoke. ? Exercise and changes to your diet. ? Comfort measures (palliative care). Follow these instructions at home: Medicines  Take over-the-counter and prescription medicines only as told by your doctor.  Talk to your doctor before taking any cough or allergy medicines. You may need to avoid medicines that cause your lungs to be dry. Lifestyle  If you smoke, stop. Smoking makes the problem worse. If you need help quitting, ask your doctor.  Avoid being around things that make your breathing worse. This may include smoke, chemicals, and fumes.  Stay active, but remember to rest as well.  Learn and use tips on how to relax.  Make sure you get enough sleep. Most adults need at least 7 hours of sleep every night.  Eat healthy foods. Eat smaller meals more often. Rest before meals. Controlled breathing Learn and use tips on how to control your breathing as told by your doctor. Try:  Breathing in (inhaling) through your nose for 1 second. Then, pucker your lips and breath out (exhale) through your lips for 2 seconds.  Putting one hand on your belly (abdomen). Breathe in slowly through your nose for 1 second. Your hand on your belly should move out. Pucker your lips and breathe out slowly through your lips. Your hand on your belly should move in as you breathe out.  Controlled coughing Learn  and use controlled coughing to clear mucus from your lungs. Follow these steps: 1. Lean your head a little forward. 2. Breathe in deeply. 3. Try to hold your breath for 3 seconds. 4. Keep your mouth slightly open while coughing 2 times. 5. Spit any mucus out into a tissue. 6. Rest and do the steps again 1 or 2 times as needed. General instructions  Make sure you get all the shots (vaccines) that your doctor recommends. Ask your doctor about a flu shot and a pneumonia shot.  Use oxygen therapy and pulmonary rehabilitation if told by your doctor. If you need home oxygen therapy, ask your doctor if you should buy a tool to measure your oxygen level (oximeter).  Make a COPD action plan with your doctor. This helps you to know what to do if you feel worse than usual.  Manage any other conditions you have as told by your doctor.  Avoid going outside when it is very hot, cold, or humid.  Avoid people who have a sickness you can catch (contagious).  Keep all follow-up visits as told by your doctor. This is important. Contact a doctor if:  You cough up more mucus than usual.  There is a change in the color or thickness of the mucus.  It is harder to breathe than usual.  Your breathing is faster than usual.  You have trouble sleeping.  You need to use your medicines more often than usual.  You have trouble doing your normal activities such as getting dressed   or walking around the house. Get help right away if:  You have shortness of breath while resting.  You have shortness of breath that stops you from: ? Being able to talk. ? Doing normal activities.  Your chest hurts for longer than 5 minutes.  Your skin color is more blue than usual.  Your pulse oximeter shows that you have low oxygen for longer than 5 minutes.  You have a fever.  You feel too tired to breathe normally. Summary  Chronic obstructive pulmonary disease (COPD) is a long-term lung problem.  The way your  lungs work will never return to normal. Usually the condition gets worse over time. There are things you can do to keep yourself as healthy as possible.  Take over-the-counter and prescription medicines only as told by your doctor.  If you smoke, stop. Smoking makes the problem worse. This information is not intended to replace advice given to you by your health care provider. Make sure you discuss any questions you have with your health care provider. Document Revised: 11/11/2017 Document Reviewed: 01/03/2017 Elsevier Patient Education  2020 Elsevier Inc.  

## 2020-09-22 NOTE — Progress Notes (Signed)
Changed original ct order, [name]

## 2020-10-02 ENCOUNTER — Ambulatory Visit: Admission: RE | Admit: 2020-10-02 | Payer: PPO | Source: Ambulatory Visit

## 2020-10-06 ENCOUNTER — Other Ambulatory Visit: Payer: Self-pay

## 2020-10-06 DIAGNOSIS — J449 Chronic obstructive pulmonary disease, unspecified: Secondary | ICD-10-CM

## 2020-10-12 DIAGNOSIS — J449 Chronic obstructive pulmonary disease, unspecified: Secondary | ICD-10-CM | POA: Diagnosis not present

## 2020-10-15 ENCOUNTER — Other Ambulatory Visit: Payer: Self-pay

## 2020-10-15 ENCOUNTER — Ambulatory Visit: Payer: PPO | Admitting: Internal Medicine

## 2020-10-15 DIAGNOSIS — R0602 Shortness of breath: Secondary | ICD-10-CM

## 2020-10-15 DIAGNOSIS — J449 Chronic obstructive pulmonary disease, unspecified: Secondary | ICD-10-CM

## 2020-10-15 LAB — PULMONARY FUNCTION TEST

## 2020-10-17 DIAGNOSIS — J019 Acute sinusitis, unspecified: Secondary | ICD-10-CM | POA: Diagnosis not present

## 2020-10-17 DIAGNOSIS — B9689 Other specified bacterial agents as the cause of diseases classified elsewhere: Secondary | ICD-10-CM | POA: Diagnosis not present

## 2020-10-18 ENCOUNTER — Other Ambulatory Visit: Payer: Self-pay | Admitting: Internal Medicine

## 2020-10-19 NOTE — Procedures (Signed)
[place], [id number]  DATE OF SERVICE: October 15, 2020  Complete Pulmonary Function Testing Interpretation:  FINDINGS:  The forced vital capacity is moderately decreased.  FEV1 is 1.03 L which is 35% predicted and is severely decreased.  FEV1 FVC ratio is severely decreased.  Postbronchodilator there did not appear to be significant improvement in the FEV1 however clinical improvement may still occur in the absence of spirometric improvement.  Total lung capacity is normal.  Residual volume is increased.  Residual volume total capacity ratio is increased.  FRC is increased.  DLCO is severely decreased.  IMPRESSION:  This pulmonary function study is consistent with severe obstructive lung disease.  There did not appear to be response to bronchodilators however clinical improvement may still occur in the absence of spirometric improvement and does not preclude the use of bronchodilators.  Allyne Gee, MD [facility] Pulmonary Critical Care Medicine Sleep Medicine

## 2020-10-23 DIAGNOSIS — Z03818 Encounter for observation for suspected exposure to other biological agents ruled out: Secondary | ICD-10-CM | POA: Diagnosis not present

## 2020-10-23 DIAGNOSIS — J0140 Acute pansinusitis, unspecified: Secondary | ICD-10-CM | POA: Diagnosis not present

## 2020-11-11 DIAGNOSIS — J449 Chronic obstructive pulmonary disease, unspecified: Secondary | ICD-10-CM | POA: Diagnosis not present

## 2020-12-01 ENCOUNTER — Other Ambulatory Visit: Payer: Self-pay | Admitting: Internal Medicine

## 2020-12-01 DIAGNOSIS — J449 Chronic obstructive pulmonary disease, unspecified: Secondary | ICD-10-CM

## 2020-12-12 DIAGNOSIS — J449 Chronic obstructive pulmonary disease, unspecified: Secondary | ICD-10-CM | POA: Diagnosis not present

## 2020-12-23 ENCOUNTER — Other Ambulatory Visit: Payer: Self-pay

## 2020-12-23 ENCOUNTER — Encounter: Payer: Self-pay | Admitting: Internal Medicine

## 2020-12-23 ENCOUNTER — Ambulatory Visit: Payer: PPO | Admitting: Internal Medicine

## 2020-12-23 VITALS — BP 130/80 | HR 82 | Temp 97.8°F | Resp 16 | Ht 67.0 in | Wt 182.0 lb

## 2020-12-23 DIAGNOSIS — R0602 Shortness of breath: Secondary | ICD-10-CM

## 2020-12-23 DIAGNOSIS — J449 Chronic obstructive pulmonary disease, unspecified: Secondary | ICD-10-CM

## 2020-12-23 DIAGNOSIS — J4489 Other specified chronic obstructive pulmonary disease: Secondary | ICD-10-CM

## 2020-12-23 DIAGNOSIS — J45909 Unspecified asthma, uncomplicated: Secondary | ICD-10-CM

## 2020-12-23 NOTE — Progress Notes (Signed)
[facility] [place], [place] [address]  Pulmonary Sleep Medicine   Office Visit Note  Patient Name: [name] DOB: January 05, 1951 MRN [id number]  Date of Service: 12/23/2020  Complaints/HPI: COPD he has been doing well no admissions to the hospital. States he is using his inhalers symbicort and albuterol. Patient also states he uses oxygen at night. Patient has had no cough or congestion noted at this time. Not smoking  ROS  General: (-) fever, (-) chills, (-) night sweats, (-) weakness Skin: (-) rashes, (-) itching,. Eyes: (-) visual changes, (-) redness, (-) itching. Nose and Sinuses: (-) nasal stuffiness or itchiness, (-) postnasal drip, (-) nosebleeds, (-) sinus trouble. Mouth and Throat: (-) sore throat, (-) hoarseness. Neck: (-) swollen glands, (-) enlarged thyroid, (-) neck pain. Respiratory: - cough, (-) bloody sputum, + shortness of breath, - wheezing. Cardiovascular: - ankle swelling, (-) chest pain. Lymphatic: (-) lymph node enlargement. Neurologic: (-) numbness, (-) tingling. Psychiatric: (-) anxiety, (-) depression   Current Medication: Outpatient Encounter Medications as of 12/23/2020  Medication Sig  . albuterol (PROVENTIL) (2.5 MG/3ML) 0.083% nebulizer solution Take 3 mLs (2.5 mg total) by nebulization every 6 (six) hours as needed for wheezing or shortness of breath.  Marland Kitchen albuterol (VENTOLIN HFA) 108 (90 Base) MCG/ACT inhaler INHALE 2 PUFFS BY MOUTH EVERY 4 TO 6 HOURS AS NEEDED  . budesonide-formoterol (SYMBICORT) 160-4.5 MCG/ACT inhaler TAKE 2 PUFFS BY MOUTH TWICE A DAY  . meloxicam (MOBIC) 15 MG tablet meloxicam 15 mg tablet  TAKE 1 TABLET BY MOUTH EVERY DAY  . OXYGEN Inhale into the lungs. 2 litre at night AHP  . SPIRIVA HANDIHALER 18 MCG inhalation capsule INHALE 1 CAPSULE VIA HANDIHALER ONCE DAILY AT THE SAME TIME EVERY DAY   No facility-administered encounter medications on file as of 12/23/2020.    Surgical History: History  reviewed. No pertinent surgical history.  Medical History: Past Medical History:  Diagnosis Date  . Anxiety   . Asthma   . COPD (chronic obstructive pulmonary disease) ([place])   . Environmental allergies   . Sinusitis     Family History: Family History  Problem Relation Age of Onset  . Colon cancer Mother   . Hypertension Father     Social History: Social History   Socioeconomic History  . Marital status: Single    Spouse name: Not on file  . Number of children: Not on file  . Years of education: Not on file  . Highest education level: Not on file  Occupational History  . Not on file  Tobacco Use  . Smoking status: Former Research scientist (life sciences)  . Smokeless tobacco: Never Used  Substance and Sexual Activity  . Alcohol use: Not Currently    Comment: occational  . Drug use: No  . Sexual activity: Not on file  Other Topics Concern  . Not on file  Social History Narrative  . Not on file   Social Determinants of Health   Financial Resource Strain: Not on file  Food Insecurity: Not on file  Transportation Needs: Not on file  Physical Activity: Not on file  Stress: Not on file  Social Connections: Not on file  Intimate Partner Violence: Not on file    Vital Signs: Blood pressure 130/80, pulse 82, temperature 97.8 F (36.6 C), resp. rate 16, height 5\' 7"  (1.702 m), weight 182 lb (82.6 kg), SpO2 97 %.  Examination: General Appearance: The patient is well-developed, well-nourished, and in no distress. Skin: Gross inspection of skin  unremarkable. Head: normocephalic, no gross deformities. Eyes: no gross deformities noted. ENT: ears appear grossly normal no exudates. Neck: Supple. No thyromegaly. No LAD. Respiratory: no rhonchi noted. Cardiovascular: Normal S1 and S2 without murmur or rub. Extremities: No cyanosis. pulses are equal. Neurologic: Alert and oriented. No involuntary movements.  LABS: Recent Results (from the past 2160 hour(s))  Pulmonary function test      Status: None   Collection Time: 10/15/20 12:00 AM  Result Value Ref Range   FEV1     FVC     FEV1/FVC     TLC     DLCO      Radiology: DG Chest 2 View  Result Date: 02/08/2019 CLINICAL DATA:  70 y/o  M; chest pain and productive cough. EXAM: CHEST - 2 VIEW COMPARISON:  07/14/2018 chest radiograph FINDINGS: Stable heart size and mediastinal contours are within normal limits. Both lungs are clear. The visualized skeletal structures are unremarkable. IMPRESSION: No acute pulmonary process identified. Electronically Signed   By: Kristine Garbe M.D.   On: 02/08/2019 15:07    No results found.  No results found.    Assessment and Plan: Patient Active Problem List   Diagnosis Date Noted  . COPD (chronic obstructive pulmonary disease) ([place]) 01/10/2018  . Emphysema, unspecified ([place]) 01/10/2018  . Shortness of breath 01/10/2018  . Prostate cancer ([place]) 12/21/2016  . Elevated PSA 12/02/2016    1. COPD doing well reviewed his meds he will continue with current meds 2. SOB reviewed the patients spiro 3. HTN baseline controlled 4. Allergies antihistamines as needed  General Counseling: I have discussed the findings of the evaluation and examination with [name].  I have also discussed any further diagnostic evaluation thatmay be needed or ordered today. Brach verbalizes understanding of the findings of todays visit. We also reviewed his medications today and discussed drug interactions and side effects including but not limited excessive drowsiness and altered mental states. We also discussed that there is always a risk not just to him but also people around him. he has been encouraged to call the office with any questions or concerns that should arise related to todays visit.  No orders of the defined types were placed in this encounter.    Time spent: 63  I have personally obtained a history, examined the patient, evaluated laboratory and imaging results, formulated the  assessment and plan and placed orders.    Allyne Gee, MD [facility] Pulmonary and Critical Care Sleep medicine

## 2020-12-23 NOTE — Patient Instructions (Signed)
Chronic Obstructive Pulmonary Disease  Chronic obstructive pulmonary disease (COPD) is a long-term (chronic) lung problem. When you have COPD, it is hard for air to get in and out of your lungs. Usually the condition gets worse over time, and your lungs will never return to normal. There are things you can do to keep yourself as healthy as possible. What are the causes?  Smoking. This is the most common cause.  Certain genes passed from parent to child (inherited). What increases the risk?  Being exposed to secondhand smoke from cigarettes, pipes, or cigars.  Being exposed to chemicals and other irritants, such as fumes and dust in the work environment.  Having chronic lung conditions or infections. What are the signs or symptoms?  Shortness of breath, especially during physical activity.  A long-term cough with a large amount of thick mucus. Sometimes, the cough may not have any mucus (dry cough).  Wheezing.  Breathing quickly.  Skin that looks gray or blue, especially in the fingers, toes, or lips.  Feeling tired (fatigue).  Weight loss.  Chest tightness.  Having infections often.  Episodes when breathing symptoms become much worse (exacerbations). At the later stages of this disease, you may have swelling in the ankles, feet, or legs. How is this treated?  Taking medicines.  Quitting smoking, if you smoke.  Rehabilitation. This includes steps to make your body work better. It may involve a team of specialists.  Doing exercises.  Making changes to your diet.  Using oxygen.  Lung surgery.  Lung transplant.  Comfort measures (palliative care). Follow these instructions at home: Medicines  Take over-the-counter and prescription medicines only as told by your doctor.  Talk to your doctor before taking any cough or allergy medicines. You may need to avoid medicines that cause your lungs to be dry. Lifestyle  If you smoke, stop smoking. Smoking makes the  problem worse.  Do not smoke or use any products that contain nicotine or tobacco. If you need help quitting, ask your doctor.  Avoid being around things that make your breathing worse. This may include smoke, chemicals, and fumes.  Stay active, but remember to rest as well.  Learn and use tips on how to manage stress and control your breathing.  Make sure you get enough sleep. Most adults need at least 7 hours of sleep every night.  Eat healthy foods. Eat smaller meals more often. Rest before meals. Controlled breathing Learn and use tips on how to control your breathing as told by your doctor. Try:  Breathing in (inhaling) through your nose for 1 second. Then, pucker your lips and breath out (exhale) through your lips for 2 seconds.  Putting one hand on your belly (abdomen). Breathe in slowly through your nose for 1 second. Your hand on your belly should move out. Pucker your lips and breathe out slowly through your lips. Your hand on your belly should move in as you breathe out.   Controlled coughing Learn and use controlled coughing to clear mucus from your lungs. Follow these steps: 1. Lean your head a little forward. 2. Breathe in deeply. 3. Try to hold your breath for 3 seconds. 4. Keep your mouth slightly open while coughing 2 times. 5. Spit any mucus out into a tissue. 6. Rest and do the steps again 1 or 2 times as needed. General instructions  Make sure you get all the shots (vaccines) that your doctor recommends. Ask your doctor about a flu shot and a pneumonia shot.    Use oxygen therapy and pulmonary rehabilitation if told by your doctor. If you need home oxygen therapy, ask your doctor if you should buy a tool to measure your oxygen level (oximeter).  Make a COPD action plan with your doctor. This helps you to know what to do if you feel worse than usual.  Manage any other conditions you have as told by your doctor.  Avoid going outside when it is very hot, cold, or  humid.  Avoid people who have a sickness you can catch (contagious).  Keep all follow-up visits. Contact a doctor if:  You cough up more mucus than usual.  There is a change in the color or thickness of the mucus.  It is harder to breathe than usual.  Your breathing is faster than usual.  You have trouble sleeping.  You need to use your medicines more often than usual.  You have trouble doing your normal activities such as getting dressed or walking around the house. Get help right away if:  You have shortness of breath while resting.  You have shortness of breath that stops you from: ? Being able to talk. ? Doing normal activities.  Your chest hurts for longer than 5 minutes.  Your skin color is more blue than usual.  Your pulse oximeter shows that you have low oxygen for longer than 5 minutes.  You have a fever.  You feel too tired to breathe normally. These symptoms may represent a serious problem that is an emergency. Do not wait to see if the symptoms will go away. Get medical help right away. Call your local emergency services (911 in the [place] Do not drive yourself to the hospital. Summary  Chronic obstructive pulmonary disease (COPD) is a long-term lung problem.  The way your lungs work will never return to normal. Usually the condition gets worse over time. There are things you can do to keep yourself as healthy as possible.  Take over-the-counter and prescription medicines only as told by your doctor.  If you smoke, stop. Smoking makes the problem worse. This information is not intended to replace advice given to you by your health care provider. Make sure you discuss any questions you have with your health care provider. Document Revised: 10/07/2020 Document Reviewed: 10/07/2020 Elsevier Patient Education  2021 Elsevier Inc.   

## 2020-12-24 ENCOUNTER — Other Ambulatory Visit: Payer: Self-pay

## 2020-12-24 DIAGNOSIS — J449 Chronic obstructive pulmonary disease, unspecified: Secondary | ICD-10-CM

## 2020-12-24 MED ORDER — BUDESONIDE-FORMOTEROL FUMARATE 160-4.5 MCG/ACT IN AERO: INHALATION_SPRAY | RESPIRATORY_TRACT | 3 refills | Status: DC

## 2021-01-12 DIAGNOSIS — J449 Chronic obstructive pulmonary disease, unspecified: Secondary | ICD-10-CM | POA: Diagnosis not present

## 2021-02-09 DIAGNOSIS — J449 Chronic obstructive pulmonary disease, unspecified: Secondary | ICD-10-CM | POA: Diagnosis not present

## 2021-02-15 ENCOUNTER — Other Ambulatory Visit: Payer: Self-pay | Admitting: Hospice and Palliative Medicine

## 2021-03-12 DIAGNOSIS — J449 Chronic obstructive pulmonary disease, unspecified: Secondary | ICD-10-CM | POA: Diagnosis not present

## 2021-03-19 ENCOUNTER — Other Ambulatory Visit: Payer: Self-pay | Admitting: Hospice and Palliative Medicine

## 2021-03-19 DIAGNOSIS — J449 Chronic obstructive pulmonary disease, unspecified: Secondary | ICD-10-CM

## 2021-04-10 DIAGNOSIS — M1711 Unilateral primary osteoarthritis, right knee: Secondary | ICD-10-CM | POA: Diagnosis not present

## 2021-04-11 DIAGNOSIS — J449 Chronic obstructive pulmonary disease, unspecified: Secondary | ICD-10-CM | POA: Diagnosis not present

## 2021-06-22 ENCOUNTER — Ambulatory Visit: Payer: PPO | Admitting: Internal Medicine

## 2021-07-01 ENCOUNTER — Other Ambulatory Visit: Payer: Self-pay

## 2021-07-01 MED ORDER — SPIRIVA HANDIHALER 18 MCG IN CAPS: ORAL_CAPSULE | RESPIRATORY_TRACT | 3 refills | Status: DC

## 2021-07-06 ENCOUNTER — Ambulatory Visit: Payer: PPO | Admitting: Internal Medicine

## 2021-07-20 ENCOUNTER — Ambulatory Visit (INDEPENDENT_AMBULATORY_CARE_PROVIDER_SITE_OTHER): Payer: PPO | Admitting: Internal Medicine

## 2021-07-20 ENCOUNTER — Other Ambulatory Visit: Payer: Self-pay

## 2021-07-20 ENCOUNTER — Encounter (INDEPENDENT_AMBULATORY_CARE_PROVIDER_SITE_OTHER): Payer: Self-pay

## 2021-07-20 ENCOUNTER — Encounter: Payer: Self-pay | Admitting: Internal Medicine

## 2021-07-20 VITALS — BP 138/80 | HR 74 | Temp 97.8°F | Resp 16 | Ht 67.0 in | Wt 170.0 lb

## 2021-07-20 DIAGNOSIS — G4734 Idiopathic sleep related nonobstructive alveolar hypoventilation: Secondary | ICD-10-CM | POA: Diagnosis not present

## 2021-07-20 DIAGNOSIS — R0602 Shortness of breath: Secondary | ICD-10-CM

## 2021-07-20 DIAGNOSIS — I10 Essential (primary) hypertension: Secondary | ICD-10-CM | POA: Diagnosis not present

## 2021-07-20 DIAGNOSIS — J449 Chronic obstructive pulmonary disease, unspecified: Secondary | ICD-10-CM | POA: Diagnosis not present

## 2021-07-20 NOTE — Progress Notes (Signed)
[facility] [place], [place] [address]  Pulmonary Sleep Medicine   Office Visit Note  Patient Name: [name] DOB: [birth year] MRN [id number]  Date of Service: 07/20/2021  Complaints/HPI: On oxygen therapy at night for COPD. He feels his breathing is under better control. On symbicort also. Denies having chest pain at this time. Feels more stamina walking. Denies having a cough. Feels a little worse with the excessive heat.Currently on 2lpm oxygen.   ROS  General: (-) fever, (-) chills, (-) night sweats, (-) weakness Skin: (-) rashes, (-) itching,. Eyes: (-) visual changes, (-) redness, (-) itching. Nose and Sinuses: (-) nasal stuffiness or itchiness, (-) postnasal drip, (-) nosebleeds, (-) sinus trouble. Mouth and Throat: (-) sore throat, (-) hoarseness. Neck: (-) swollen glands, (-) enlarged thyroid, (-) neck pain. Respiratory: - cough, (-) bloody sputum, + shortness of breath, - wheezing. Cardiovascular: - ankle swelling, (-) chest pain. Lymphatic: (-) lymph node enlargement. Neurologic: (-) numbness, (-) tingling. Psychiatric: (-) anxiety, (-) depression   Current Medication: Outpatient Encounter Medications as of 07/20/2021  Medication Sig   albuterol (PROVENTIL) (2.5 MG/3ML) 0.083% nebulizer solution Take 3 mLs (2.5 mg total) by nebulization every 6 (six) hours as needed for wheezing or shortness of breath.   albuterol (VENTOLIN HFA) 108 (90 Base) MCG/ACT inhaler INHALE 2 PUFFS BY MOUTH EVERY 4 TO 6 HOURS AS NEEDED   budesonide-formoterol (SYMBICORT) 160-4.5 MCG/ACT inhaler TAKE 2 PUFFS BY MOUTH TWICE A DAY   meloxicam (MOBIC) 15 MG tablet meloxicam 15 mg tablet  TAKE 1 TABLET BY MOUTH EVERY DAY   OXYGEN Inhale into the lungs. 2 litre at night AHP   tiotropium (SPIRIVA HANDIHALER) 18 MCG inhalation capsule INHALE 1 CAPSULE VIA HANDIHALER ONCE DAILY AT THE SAME TIME EVERY DAY   No facility-administered encounter medications on file as of  07/20/2021.    Surgical History: History reviewed. No pertinent surgical history.  Medical History: Past Medical History:  Diagnosis Date   Anxiety    Asthma    COPD (chronic obstructive pulmonary disease) ([facility])    Environmental allergies    Sinusitis     Family History: Family History  Problem Relation Age of Onset   Colon cancer Mother    Hypertension Father     Social History: Social History   Socioeconomic History   Marital status: Single    Spouse name: Not on file   Number of children: Not on file   Years of education: Not on file   Highest education level: Not on file  Occupational History   Not on file  Tobacco Use   Smoking status: Former   Smokeless tobacco: Never  Substance and Sexual Activity   Alcohol use: Not Currently    Comment: occational   Drug use: No   Sexual activity: Not on file  Other Topics Concern   Not on file  Social History Narrative   Not on file   Social Determinants of Health   Financial Resource Strain: Not on file  Food Insecurity: Not on file  Transportation Needs: Not on file  Physical Activity: Not on file  Stress: Not on file  Social Connections: Not on file  Intimate Partner Violence: Not on file    Vital Signs: Blood pressure 138/80, pulse 74, temperature 97.8 F (36.6 C), resp. rate 16, height '5\' 7"'$  (1.702 m), weight 170 lb (77.1 kg), SpO2 96 %.  Examination: General Appearance: The patient is well-developed, well-nourished, and in no distress. Skin: Gross  inspection of skin unremarkable. Head: normocephalic, no gross deformities. Eyes: no gross deformities noted. ENT: ears appear grossly normal no exudates. Neck: Supple. No thyromegaly. No LAD. Respiratory: no rhonchi noted at this time. Cardiovascular: Normal S1 and S2 without murmur or rub. Extremities: No cyanosis. pulses are equal. Neurologic: Alert and oriented. No involuntary movements.  LABS: No results found for this or any previous visit (from  the past 2160 hour(s)).  Radiology: DG Chest 2 View  Result Date: 02/08/2019 CLINICAL DATA:  70 y/o  M; chest pain and productive cough. EXAM: CHEST - 2 VIEW COMPARISON:  07/14/2018 chest radiograph FINDINGS: Stable heart size and mediastinal contours are within normal limits. Both lungs are clear. The visualized skeletal structures are unremarkable. IMPRESSION: No acute pulmonary process identified. Electronically Signed   By: Kristine Garbe M.D.   On: 02/08/2019 15:07    No results found.  No results found.    Assessment and Plan: Patient Active Problem List   Diagnosis Date Noted   COPD (chronic obstructive pulmonary disease) ([place]) 01/10/2018   Emphysema, unspecified ([place]) 01/10/2018   Shortness of breath 01/10/2018   Prostate cancer ([place]) 12/21/2016   Elevated PSA 12/02/2016    1. SOB (shortness of breath)  - Spirometry with Graph done and reviewed  2. Chronic obstructive pulmonary disease, unspecified COPD type ([place]) Under control with current regimen plus oxygen therapy  3. Nocturnal hypoxia On 2lpm oxygen  4. Essential hypertension Medical therapy follow up with pcp   General Counseling: I have discussed the findings of the evaluation and examination with [name].  I have also discussed any further diagnostic evaluation thatmay be needed or ordered today. Linn verbalizes understanding of the findings of todays visit. We also reviewed his medications today and discussed drug interactions and side effects including but not limited excessive drowsiness and altered mental states. We also discussed that there is always a risk not just to him but also people around him. he has been encouraged to call the office with any questions or concerns that should arise related to todays visit.  Orders Placed This Encounter  Procedures   Spirometry with Graph    Order Specific Question:   Where should this test be performed?    Answer:   Other     Time spent: 35  I have  personally obtained a history, examined the patient, evaluated laboratory and imaging results, formulated the assessment and plan and placed orders.    Allyne Gee, MD [facility] Pulmonary and Critical Care Sleep medicine

## 2021-07-20 NOTE — Patient Instructions (Signed)

## 2021-09-24 ENCOUNTER — Telehealth: Payer: Self-pay

## 2021-09-24 NOTE — Telephone Encounter (Signed)
Left voicemail for patient in regards to O2 recert. Asked patient to call the office to schedule an appointment for a 6 min walk.

## 2021-09-25 ENCOUNTER — Telehealth: Payer: Self-pay

## 2021-09-25 NOTE — Telephone Encounter (Signed)
Lvm in a second attempt to have patient scheduled for a 6 min walk and office visit for oxygen recert.

## 2021-10-11 ENCOUNTER — Other Ambulatory Visit: Payer: Self-pay

## 2021-10-11 DIAGNOSIS — J449 Chronic obstructive pulmonary disease, unspecified: Secondary | ICD-10-CM

## 2021-10-11 MED ORDER — BUDESONIDE-FORMOTEROL FUMARATE 160-4.5 MCG/ACT IN AERO: INHALATION_SPRAY | RESPIRATORY_TRACT | 3 refills | Status: DC

## 2021-10-12 ENCOUNTER — Telehealth: Payer: Self-pay

## 2021-10-12 ENCOUNTER — Other Ambulatory Visit: Payer: Self-pay | Admitting: Internal Medicine

## 2021-10-12 DIAGNOSIS — J449 Chronic obstructive pulmonary disease, unspecified: Secondary | ICD-10-CM

## 2021-10-12 NOTE — Telephone Encounter (Signed)
Patient called stating they need a refill on Symbicort and something sent in place for his Proair. Nurse will be sending in Ventolin. Patient understood and agreed.

## 2021-10-26 ENCOUNTER — Other Ambulatory Visit: Payer: Self-pay

## 2021-10-26 ENCOUNTER — Encounter: Payer: Self-pay | Admitting: Internal Medicine

## 2021-10-26 ENCOUNTER — Ambulatory Visit (INDEPENDENT_AMBULATORY_CARE_PROVIDER_SITE_OTHER): Payer: PPO | Admitting: Internal Medicine

## 2021-10-26 VITALS — BP 147/86 | HR 80 | Temp 98.4°F | Resp 16 | Ht 67.0 in | Wt 163.6 lb

## 2021-10-26 DIAGNOSIS — G4734 Idiopathic sleep related nonobstructive alveolar hypoventilation: Secondary | ICD-10-CM | POA: Diagnosis not present

## 2021-10-26 DIAGNOSIS — J449 Chronic obstructive pulmonary disease, unspecified: Secondary | ICD-10-CM

## 2021-10-26 DIAGNOSIS — R0602 Shortness of breath: Secondary | ICD-10-CM | POA: Diagnosis not present

## 2021-10-26 NOTE — Progress Notes (Signed)
[facility] [place], [place] [address]  Pulmonary Sleep Medicine   Office Visit Note  Patient Name: [name] DOB: 1951-04-19 MRN [id number]  Date of Service: 10/26/2021  Complaints/HPI: On oxygen and doing well. He states his inhalers are helping. Patient has had no admissions to the hospitals. Patient has been on 2lpm and saturations have been maintaining.  Patient has done well otherwise.  Denies having any chest pain no palpitations no cough.  Inhalers are being used as already indicated states that the inhalers are helping him significantly.  ROS  General: (-) fever, (-) chills, (-) night sweats, (-) weakness Skin: (-) rashes, (-) itching,. Eyes: (-) visual changes, (-) redness, (-) itching. Nose and Sinuses: (-) nasal stuffiness or itchiness, (-) postnasal drip, (-) nosebleeds, (-) sinus trouble. Mouth and Throat: (-) sore throat, (-) hoarseness. Neck: (-) swollen glands, (-) enlarged thyroid, (-) neck pain. Respiratory: - cough, (-) bloody sputum, + shortness of breath, - wheezing. Cardiovascular: - ankle swelling, (-) chest pain. Lymphatic: (-) lymph node enlargement. Neurologic: (-) numbness, (-) tingling. Psychiatric: (-) anxiety, (-) depression   Current Medication: Outpatient Encounter Medications as of 10/26/2021  Medication Sig   albuterol (PROVENTIL) (2.5 MG/3ML) 0.083% nebulizer solution Take 3 mLs (2.5 mg total) by nebulization every 6 (six) hours as needed for wheezing or shortness of breath.   albuterol (VENTOLIN HFA) 108 (90 Base) MCG/ACT inhaler TAKE 2 PUFFS BY MOUTH EVERY 4 TO 6 HOURS AS NEEDED   budesonide-formoterol (SYMBICORT) 160-4.5 MCG/ACT inhaler TAKE 2 PUFFS BY MOUTH TWICE A DAY   meloxicam (MOBIC) 15 MG tablet meloxicam 15 mg tablet  TAKE 1 TABLET BY MOUTH EVERY DAY   OXYGEN Inhale into the lungs. 2 litre at night AHP   tiotropium (SPIRIVA HANDIHALER) 18 MCG inhalation capsule INHALE 1 CAPSULE VIA HANDIHALER ONCE  DAILY AT THE SAME TIME EVERY DAY   No facility-administered encounter medications on file as of 10/26/2021.    Surgical History: History reviewed. No pertinent surgical history.  Medical History: Past Medical History:  Diagnosis Date   Anxiety    Asthma    COPD (chronic obstructive pulmonary disease) (HCC)    Environmental allergies    Sinusitis     Family History: Family History  Problem Relation Age of Onset   Colon cancer Mother    Hypertension Father     Social History: Social History   Socioeconomic History   Marital status: Single    Spouse name: Not on file   Number of children: Not on file   Years of education: Not on file   Highest education level: Not on file  Occupational History   Not on file  Tobacco Use   Smoking status: Former   Smokeless tobacco: Never  Substance and Sexual Activity   Alcohol use: Not Currently    Comment: occational   Drug use: No   Sexual activity: Not on file  Other Topics Concern   Not on file  Social History Narrative   Not on file   Social Determinants of Health   Financial Resource Strain: Not on file  Food Insecurity: Not on file  Transportation Needs: Not on file  Physical Activity: Not on file  Stress: Not on file  Social Connections: Not on file  Intimate Partner Violence: Not on file    Vital Signs: Blood pressure (!) 147/86, pulse 80, temperature 98.4 F (36.9 C), resp. rate 16, height 5\' 7"  (1.702 m), weight 163 lb 9.6 oz (74.2  kg), SpO2 97 %.  Examination: General Appearance: The patient is well-developed, well-nourished, and in no distress. Skin: Gross inspection of skin unremarkable. Head: normocephalic, no gross deformities. Eyes: no gross deformities noted. ENT: ears appear grossly normal no exudates. Neck: Supple. No thyromegaly. No LAD. Respiratory: no rhonchi noted at this time. Cardiovascular: Normal S1 and S2 without murmur or rub. Extremities: No cyanosis. pulses are equal. Neurologic:  Alert and oriented. No involuntary movements.  LABS: No results found for this or any previous visit (from the past 2160 hour(s)).  Radiology: DG Chest 2 View  Result Date: 02/08/2019 CLINICAL DATA:  70 y/o  M; chest pain and productive cough. EXAM: CHEST - 2 VIEW COMPARISON:  07/14/2018 chest radiograph FINDINGS: Stable heart size and mediastinal contours are within normal limits. Both lungs are clear. The visualized skeletal structures are unremarkable. IMPRESSION: No acute pulmonary process identified. Electronically Signed   By: Kristine Garbe M.D.   On: 02/08/2019 15:07    No results found.  No results found.    Assessment and Plan: Patient Active Problem List   Diagnosis Date Noted   COPD (chronic obstructive pulmonary disease) ([place]) 01/10/2018   Emphysema, unspecified ([place]) 01/10/2018   Shortness of breath 01/10/2018   Prostate cancer ([place]) 12/21/2016   Elevated PSA 12/02/2016    1. Chronic obstructive pulmonary disease, unspecified COPD type ([place]) On proair symbicort and spiriva. Doing well overall will continue with currentregimen  2. Nocturnal hypoxia On oxygen therapy has been using 2lpm  3. Shortness of breath improving   General Counseling: I have discussed the findings of the evaluation and examination with [name].  I have also discussed any further diagnostic evaluation thatmay be needed or ordered today. [name] verbalizes understanding of the findings of todays visit. We also reviewed his medications today and discussed drug interactions and side effects including but not limited excessive drowsiness and altered mental states. We also discussed that there is always a risk not just to him but also people around him. he has been encouraged to call the office with any questions or concerns that should arise related to todays visit.  No orders of the defined types were placed in this encounter.    Time spent: 90  I have personally obtained a history,  examined the patient, evaluated laboratory and imaging results, formulated the assessment and plan and placed orders.    Allyne Gee, MD [facility] Pulmonary and Critical Care Sleep medicine

## 2021-10-26 NOTE — Patient Instructions (Signed)

## 2021-11-02 ENCOUNTER — Other Ambulatory Visit: Payer: Self-pay | Admitting: Internal Medicine

## 2021-11-03 ENCOUNTER — Telehealth: Payer: Self-pay

## 2021-11-03 NOTE — Telephone Encounter (Signed)
Sent message to sarah at [facility] for an ONO order to be completed

## 2021-12-16 ENCOUNTER — Telehealth: Payer: Self-pay

## 2021-12-16 NOTE — Telephone Encounter (Signed)
Oxygen recert signed by provider and given to [facility] with [facility].

## 2022-01-18 ENCOUNTER — Ambulatory Visit: Payer: PPO | Admitting: Internal Medicine

## 2022-01-19 ENCOUNTER — Telehealth: Payer: Self-pay

## 2022-01-19 NOTE — Telephone Encounter (Signed)
Pt had overnight pulse oximetry done 01/14/22 to 01/15/22 and per [facility] pt doesn't qualify for nocturnal oxygen

## 2022-01-21 ENCOUNTER — Other Ambulatory Visit: Payer: Self-pay

## 2022-01-21 ENCOUNTER — Telehealth: Payer: Self-pay

## 2022-01-21 DIAGNOSIS — J449 Chronic obstructive pulmonary disease, unspecified: Secondary | ICD-10-CM

## 2022-01-21 MED ORDER — BUDESONIDE-FORMOTEROL FUMARATE 160-4.5 MCG/ACT IN AERO: INHALATION_SPRAY | RESPIRATORY_TRACT | 3 refills | Status: DC

## 2022-01-21 NOTE — Telephone Encounter (Deleted)
Patient called stating his insurance will no longer cover name-brand symbicort. He is asking generic be sent in to [facility] when his rx needs renewal.

## 2022-01-26 ENCOUNTER — Encounter: Payer: Self-pay | Admitting: Internal Medicine

## 2022-01-29 ENCOUNTER — Telehealth: Payer: Self-pay

## 2022-01-29 DIAGNOSIS — M1990 Unspecified osteoarthritis, unspecified site: Secondary | ICD-10-CM | POA: Diagnosis not present

## 2022-01-29 DIAGNOSIS — J449 Chronic obstructive pulmonary disease, unspecified: Secondary | ICD-10-CM | POA: Diagnosis not present

## 2022-01-29 DIAGNOSIS — G8929 Other chronic pain: Secondary | ICD-10-CM | POA: Diagnosis not present

## 2022-01-29 DIAGNOSIS — Z9981 Dependence on supplemental oxygen: Secondary | ICD-10-CM | POA: Diagnosis not present

## 2022-01-29 DIAGNOSIS — E663 Overweight: Secondary | ICD-10-CM | POA: Diagnosis not present

## 2022-01-29 DIAGNOSIS — Z87891 Personal history of nicotine dependence: Secondary | ICD-10-CM | POA: Diagnosis not present

## 2022-01-29 DIAGNOSIS — C61 Malignant neoplasm of prostate: Secondary | ICD-10-CM | POA: Diagnosis not present

## 2022-01-29 DIAGNOSIS — J9691 Respiratory failure, unspecified with hypoxia: Secondary | ICD-10-CM | POA: Diagnosis not present

## 2022-01-29 DIAGNOSIS — I10 Essential (primary) hypertension: Secondary | ICD-10-CM | POA: Diagnosis not present

## 2022-01-29 NOTE — Telephone Encounter (Signed)
PT had to retest on the ONO due to pt wore his oxygen during test and was not suppose to have it on.  Pt was retested for Overnight Oximetry and pt shows he qualifies for nocturnal oxygen and we will have pt schedule appt for f/u with Dr Devona Konig

## 2022-02-02 ENCOUNTER — Other Ambulatory Visit: Payer: Self-pay | Admitting: Internal Medicine

## 2022-02-02 ENCOUNTER — Encounter: Payer: Self-pay | Admitting: Internal Medicine

## 2022-02-02 ENCOUNTER — Other Ambulatory Visit: Payer: Self-pay

## 2022-02-02 DIAGNOSIS — R0602 Shortness of breath: Secondary | ICD-10-CM

## 2022-02-09 ENCOUNTER — Telehealth: Payer: Self-pay

## 2022-02-09 NOTE — Telephone Encounter (Signed)
Spoke with kelly from [facility] they don't need any order of oxygen due to pt is already on O2 at night pt already had appt coming up with [facility]

## 2022-02-11 ENCOUNTER — Encounter: Payer: Self-pay | Admitting: Internal Medicine

## 2022-02-17 ENCOUNTER — Other Ambulatory Visit: Payer: Self-pay

## 2022-02-17 ENCOUNTER — Ambulatory Visit: Payer: PPO | Admitting: Internal Medicine

## 2022-02-17 DIAGNOSIS — R0602 Shortness of breath: Secondary | ICD-10-CM

## 2022-02-21 NOTE — Procedures (Signed)
[facility] ?[address] ?[place], [address] ? ? ? ?Complete Pulmonary Function Testing Interpretation: ? ?FINDINGS: ? ?The forced vital capacity is moderately decreased.  The FEV1 is 0.97 L which is 33% of predicted and is severely decreased.  FEV1 FVC ratio shows severe reduction.  Postbronchodilator no significant change in the FEV1.  Total lung capacity is normal.  Residual volume is increased.  Residual volume total capacity ratio is increased.  FRC is decreased.  DLCO was severely decreased. ? ?IMPRESSION: ? ?This pulmonary function study is suggestive of severe obstructive lung disease ? ?Allyne Gee, MD FCCP ?Pulmonary Critical Care Medicine ?Sleep Medicine ? ?

## 2022-02-26 ENCOUNTER — Encounter: Payer: Self-pay | Admitting: Internal Medicine

## 2022-02-26 LAB — PULMONARY FUNCTION TEST

## 2022-03-01 ENCOUNTER — Other Ambulatory Visit: Payer: Self-pay | Admitting: Internal Medicine

## 2022-03-01 ENCOUNTER — Telehealth: Payer: Self-pay

## 2022-03-01 ENCOUNTER — Encounter: Payer: Self-pay | Admitting: Internal Medicine

## 2022-03-01 ENCOUNTER — Ambulatory Visit: Payer: PPO | Admitting: Internal Medicine

## 2022-03-01 ENCOUNTER — Other Ambulatory Visit: Payer: Self-pay

## 2022-03-01 VITALS — BP 148/84 | HR 87 | Temp 98.0°F | Resp 16 | Ht 67.0 in | Wt 173.0 lb

## 2022-03-01 DIAGNOSIS — J449 Chronic obstructive pulmonary disease, unspecified: Secondary | ICD-10-CM | POA: Diagnosis not present

## 2022-03-01 DIAGNOSIS — G4719 Other hypersomnia: Secondary | ICD-10-CM

## 2022-03-01 DIAGNOSIS — R0602 Shortness of breath: Secondary | ICD-10-CM | POA: Diagnosis not present

## 2022-03-01 NOTE — Progress Notes (Signed)
[facility] ?[address] ?[place], [place] [address] ? ?Pulmonary Sleep Medicine  ? ?Office Visit Note ? ?Patient Name: [name] ?DOB: 06/23/1951 ?MRN [id number] ? ?Date of Service: 03/01/2022 ? ?Complaints/HPI: He is here for follow up for COPD.His FEV1 is 33% at this time. Clinically he feels that he is doing better with his breathing. Using inhalers as prescribed. Patient states that he has no cough and no congestion. He had an overnight ox done and this shows significant desaturation which appears to be cyclicle. Patient should have a PSG done as this is consistent with potentially OSA ? ?ROS ? ?General: (-) fever, (-) chills, (-) night sweats, (-) weakness ?Skin: (-) rashes, (-) itching,. ?Eyes: (-) visual changes, (-) redness, (-) itching. ?Nose and Sinuses: (-) nasal stuffiness or itchiness, (-) postnasal drip, (-) nosebleeds, (-) sinus trouble. ?Mouth and Throat: (-) sore throat, (-) hoarseness. ?Neck: (-) swollen glands, (-) enlarged thyroid, (-) neck pain. ?Respiratory: - cough, (-) bloody sputum, + shortness of breath, - wheezing. ?Cardiovascular: - ankle swelling, (-) chest pain. ?Lymphatic: (-) lymph node enlargement. ?Neurologic: (-) numbness, (-) tingling. ?Psychiatric: (-) anxiety, (-) depression ? ? ?Current Medication: ?Outpatient Encounter Medications as of 03/01/2022  ?Medication Sig  ? albuterol (PROVENTIL) (2.5 MG/3ML) 0.083% nebulizer solution Take 3 mLs (2.5 mg total) by nebulization every 6 (six) hours as needed for wheezing or shortness of breath.  ? albuterol (VENTOLIN HFA) 108 (90 Base) MCG/ACT inhaler TAKE 2 PUFFS BY MOUTH EVERY 4 TO 6 HOURS AS NEEDED  ? budesonide-formoterol (SYMBICORT) 160-4.5 MCG/ACT inhaler TAKE 2 PUFFS BY MOUTH TWICE A DAY  ? meloxicam (MOBIC) 15 MG tablet meloxicam 15 mg tablet ? TAKE 1 TABLET BY MOUTH EVERY DAY  ? OXYGEN Inhale into the lungs. 2 litre at night [facility]HP  ? SPIRIVA HANDIHALER 18 MCG inhalation capsule INHALE 1 CAPSULE VIA HANDIHALER  ONCE DAILY AT THE SAME TIME EVERY DAY  ? ?No facility-administered encounter medications on file as of 03/01/2022.  ? ? ?Surgical History: ?No past surgical history on file. ? ?Medical History: ?Past Medical History:  ?Diagnosis Date  ? Anxiety   ? Asthma   ? COPD (chronic obstructive pulmonary disease) ([place])   ? Environmental allergies   ? Sinusitis   ? ? ?Family History: ?Family History  ?Problem Relation Age of Onset  ? Colon cancer Mother   ? Hypertension Father   ? ? ?Social History: ?Social History  ? ?Socioeconomic History  ? Marital status: Single  ?  Spouse name: Not on file  ? Number of children: Not on file  ? Years of education: Not on file  ? Highest education level: Not on file  ?Occupational History  ? Not on file  ?Tobacco Use  ? Smoking status: Former  ? Smokeless tobacco: Never  ?Substance and Sexual Activity  ? Alcohol use: Not Currently  ?  Comment: occational  ? Drug use: No  ? Sexual activity: Not on file  ?Other Topics Concern  ? Not on file  ?Social History Narrative  ? Not on file  ? ?Social Determinants of Health  ? ?Financial Resource Strain: Not on file  ?Food Insecurity: Not on file  ?Transportation Needs: Not on file  ?Physical Activity: Not on file  ?Stress: Not on file  ?Social Connections: Not on file  ?Intimate Partner Violence: Not on file  ? ? ?Vital Signs: ?Blood pressure (!) 148/84, pulse 87, temperature 98 ?F (36.7 ?C), resp. rate 16, height '5\' 7"'$  (1.702 m),  weight 173 lb (78.5 kg), SpO2 98 %. ? ?Examination: ?General Appearance: The patient is well-developed, well-nourished, and in no distress. ?Skin: Gross inspection of skin unremarkable. ?Head: normocephalic, no gross deformities. ?Eyes: no gross deformities noted. ?ENT: ears appear grossly normal no exudates. ?Neck: Supple. No thyromegaly. No LAD. ?Respiratory: no rhonchi noted. ?Cardiovascular: Normal S1 and S2 without murmur or rub. ?Extremities: No cyanosis. pulses are equal. ?Neurologic: Alert and oriented. No  involuntary movements. ? ?LABS: ?Recent Results (from the past 2160 hour(s))  ?Pulmonary Function Test     Status: None  ? Collection Time: 02/26/22  1:35 PM  ?Result Value Ref Range  ? FEV1    ? FVC    ? FEV1/FVC    ? TLC    ? DLCO    ? ? ?Radiology: ?DG Chest 2 View ? ?Result Date: 02/08/2019 ?CLINICAL DATA:  71 y/o  M; chest pain and productive cough. EXAM: CHEST - 2 VIEW COMPARISON:  07/14/2018 chest radiograph FINDINGS: Stable heart size and mediastinal contours are within normal limits. Both lungs are clear. The visualized skeletal structures are unremarkable. IMPRESSION: No acute pulmonary process identified. Electronically Signed   By: Kristine Garbe M.D.   On: 02/08/2019 15:07  ? ? ?No results found. ? ?No results found. ? ? ? ?Assessment and Plan: ?Patient Active Problem List  ? Diagnosis Date Noted  ? COPD (chronic obstructive pulmonary disease) ([place]) 01/10/2018  ? Emphysema, unspecified ([place]) 01/10/2018  ? Shortness of breath 01/10/2018  ? Prostate cancer ([place]) 12/21/2016  ? Elevated PSA 12/02/2016  ? ? ?1. Chronic obstructive pulmonary disease, unspecified COPD type ([place]) ?At baseline FEV1 is 33%.  He is using his inhalers plan is going to be to continue to encourage compliance ? ?2. Shortness of breath ?As usual continue with inhalers the patient has been cautioned about avoiding situations where he may be in contact with infectious people including viral infections bacterial infections he understands ? ?3. Other hypersomnia ?He does feel excessive fatigue and tiredness.  I did recommend getting a follow-up on his home sleep apnea because of the desaturations noted on his overnight oximetry and they were cyclical which appears to be consistent with someone that may have obstructive sleep apnea ?- PSG Sleep Study; Future  ? ?General Counseling: I have discussed the findings of the evaluation and examination with [name].  I have also discussed any further diagnostic evaluation thatmay be needed or  ordered today. [name] verbalizes understanding of the findings of todays visit. We also reviewed his medications today and discussed drug interactions and side effects including but not limited excessive drowsiness and altered mental states. We also discussed that there is always a risk not just to him but also people around him. he has been encouraged to call the office with any questions or concerns that should arise related to todays visit. ? ?No orders of the defined types were placed in this encounter. ?  ? ?Time spent: 45 ? ?I have personally obtained a history, examined the patient, evaluated laboratory and imaging results, formulated the assessment and plan and placed orders. ? ?  ?Allyne Gee, MD FCCP ?Pulmonary and Critical Care ?Sleep medicine ?

## 2022-03-01 NOTE — Telephone Encounter (Signed)
SS ordered. Printed. Gave to Titania-Toni ?

## 2022-03-01 NOTE — Patient Instructions (Signed)
Sleep Apnea ?Sleep apnea affects breathing during sleep. It causes breathing to stop for 10 seconds or more, or to become shallow. People with sleep apnea usually snore loudly. ?It can also increase the risk of: ?Heart attack. ?Stroke. ?Being very overweight (obese). ?Diabetes. ?Heart failure. ?Irregular heartbeat. ?High blood pressure. ?The goal of treatment is to help you breathe normally again. ?What are the causes? ?The most common cause of this condition is a collapsed or blocked airway. ?There are three kinds of sleep apnea: ?Obstructive sleep apnea. This is caused by a blocked or collapsed airway. ?Central sleep apnea. This happens when the brain does not send the right signals to the muscles that control breathing. ?Mixed sleep apnea. This is a combination of obstructive and central sleep apnea. ?What increases the risk? ?Being overweight. ?Smoking. ?Having a small airway. ?Being older. ?Being male. ?Drinking alcohol. ?Taking medicines to calm yourself (sedatives or tranquilizers). ?Having family members with the condition. ?Having a tongue or tonsils that are larger than normal. ?What are the signs or symptoms? ?Trouble staying asleep. ?Loud snoring. ?Headaches in the morning. ?Waking up gasping. ?Dry mouth or sore throat in the morning. ?Being sleepy or tired during the day. ?If you are sleepy or tired during the day, you may also: ?Not be able to focus your mind (concentrate). ?Forget things. ?Get angry a lot and have mood swings. ?Feel sad (depressed). ?Have changes in your personality. ?Have less interest in sex, if you are male. ?Be unable to have an erection, if you are male. ?How is this treated? ? ?Sleeping on your side. ?Using a medicine to get rid of mucus in your nose (decongestant). ?Avoiding the use of alcohol, medicines to help you relax, or certain pain medicines (narcotics). ?Losing weight, if needed. ?Changing your diet. ?Quitting smoking. ?Using a machine to open your airway while you  sleep, such as: ?An oral appliance. This is a mouthpiece that shifts your lower jaw forward. ?A CPAP device. This device blows air through a mask when you breathe out (exhale). ?An EPAP device. This has valves that you put in each nostril. ?A BIPAP device. This device blows air through a mask when you breathe in (inhale) and breathe out. ?Having surgery if other treatments do not work. ?Follow these instructions at home: ?Lifestyle ?Make changes that your doctor recommends. ?Eat a healthy diet. ?Lose weight if needed. ?Avoid alcohol, medicines to help you relax, and some pain medicines. ?Do not smoke or use any products that contain nicotine or tobacco. If you need help quitting, ask your doctor. ?General instructions ?Take over-the-counter and prescription medicines only as told by your doctor. ?If you were given a machine to use while you sleep, use it only as told by your doctor. ?If you are having surgery, make sure to tell your doctor you have sleep apnea. You may need to bring your device with you. ?Keep all follow-up visits. ?Contact a doctor if: ?The machine that you were given to use during sleep bothers you or does not seem to be working. ?You do not get better. ?You get worse. ?Get help right away if: ?Your chest hurts. ?You have trouble breathing in enough air. ?You have an uncomfortable feeling in your back, arms, or stomach. ?You have trouble talking. ?One side of your body feels weak. ?A part of your face is hanging down. ?These symptoms may be an emergency. Get help right away. Call your local emergency services (911 in the [place] ?Do not wait to see if the symptoms   will go away. ?Do not drive yourself to the hospital. ?Summary ?This condition affects breathing during sleep. ?The most common cause is a collapsed or blocked airway. ?The goal of treatment is to help you breathe normally while you sleep. ?This information is not intended to replace advice given to you by your health care provider. Make  sure you discuss any questions you have with your health care provider. ?Document Revised: 07/08/2021 Document Reviewed: 11/07/2020 ?Elsevier Patient Education ? 2022 [employer] ? ?

## 2022-03-02 ENCOUNTER — Telehealth: Payer: Self-pay

## 2022-03-02 NOTE — Telephone Encounter (Signed)
Lmom to call us back 

## 2022-03-12 DIAGNOSIS — J449 Chronic obstructive pulmonary disease, unspecified: Secondary | ICD-10-CM | POA: Diagnosis not present

## 2022-04-06 DIAGNOSIS — M1711 Unilateral primary osteoarthritis, right knee: Secondary | ICD-10-CM | POA: Diagnosis not present

## 2022-04-08 NOTE — Telephone Encounter (Signed)
error 

## 2022-04-11 DIAGNOSIS — J449 Chronic obstructive pulmonary disease, unspecified: Secondary | ICD-10-CM | POA: Diagnosis not present

## 2022-04-15 DIAGNOSIS — I10 Essential (primary) hypertension: Secondary | ICD-10-CM | POA: Diagnosis not present

## 2022-04-15 DIAGNOSIS — N390 Urinary tract infection, site not specified: Secondary | ICD-10-CM | POA: Diagnosis not present

## 2022-04-20 ENCOUNTER — Telehealth: Payer: Self-pay

## 2022-04-20 NOTE — Telephone Encounter (Signed)
Patient scheduled for PSG on 05/01/22 @ [facility]tat ?

## 2022-04-23 ENCOUNTER — Other Ambulatory Visit: Payer: Self-pay | Admitting: Internal Medicine

## 2022-04-23 DIAGNOSIS — J449 Chronic obstructive pulmonary disease, unspecified: Secondary | ICD-10-CM

## 2022-04-30 DIAGNOSIS — I10 Essential (primary) hypertension: Secondary | ICD-10-CM | POA: Diagnosis not present

## 2022-05-03 DIAGNOSIS — J449 Chronic obstructive pulmonary disease, unspecified: Secondary | ICD-10-CM | POA: Diagnosis not present

## 2022-05-03 DIAGNOSIS — C61 Malignant neoplasm of prostate: Secondary | ICD-10-CM | POA: Diagnosis not present

## 2022-05-03 DIAGNOSIS — Z79899 Other long term (current) drug therapy: Secondary | ICD-10-CM | POA: Diagnosis not present

## 2022-05-03 DIAGNOSIS — I10 Essential (primary) hypertension: Secondary | ICD-10-CM | POA: Diagnosis not present

## 2022-05-03 DIAGNOSIS — Z1212 Encounter for screening for malignant neoplasm of rectum: Secondary | ICD-10-CM | POA: Diagnosis not present

## 2022-05-03 DIAGNOSIS — D649 Anemia, unspecified: Secondary | ICD-10-CM | POA: Diagnosis not present

## 2022-05-03 DIAGNOSIS — Z125 Encounter for screening for malignant neoplasm of prostate: Secondary | ICD-10-CM | POA: Diagnosis not present

## 2022-05-03 DIAGNOSIS — M179 Osteoarthritis of knee, unspecified: Secondary | ICD-10-CM | POA: Diagnosis not present

## 2022-05-03 DIAGNOSIS — Z1331 Encounter for screening for depression: Secondary | ICD-10-CM | POA: Diagnosis not present

## 2022-05-03 DIAGNOSIS — Z1211 Encounter for screening for malignant neoplasm of colon: Secondary | ICD-10-CM | POA: Diagnosis not present

## 2022-05-12 ENCOUNTER — Encounter (INDEPENDENT_AMBULATORY_CARE_PROVIDER_SITE_OTHER): Payer: PPO | Admitting: Internal Medicine

## 2022-05-12 DIAGNOSIS — G4719 Other hypersomnia: Secondary | ICD-10-CM

## 2022-05-12 DIAGNOSIS — J449 Chronic obstructive pulmonary disease, unspecified: Secondary | ICD-10-CM | POA: Diagnosis not present

## 2022-05-19 NOTE — Procedures (Signed)
[place] Report Part I                                                                 Phone: [phone] Fax: [phone]  Patient Name: [name], [name] Acquisition Number: [id number]  Date of Birth: 15-Sep-1951 Acquisition Date: 05/12/2022  Referring Physician: Allyne Gee, MD     History: The patient is a 71 year old male who was referred for evaluation of possible sleep apnea with snoring and sleepiness. Medical History: anxiety, asthma, COPD, enviromental allergies,  emphysema, hypersomnia and sinustis.  Medications: Proventil, Ventolin HFA, Symbicort, Mobic, 2L of O2 and Spiriva.  Procedure: This routine overnight polysomnogram was performed on the [facility] 5 using the standard diagnostic protocol. This included 6 channels of EEG, 2 channels of EOG, chin EMG, bilateral anterior tibialis EMG, nasal/oral thermistor, PTAF (nasal pressure transducer), chest and abdominal wall movements, EKG, and pulse oximetry.  Description: The total recording time was 370.7 minutes. The total sleep time was 294.5 minutes. There were a total of 60.9 minutes of wakefulness after sleep onset for a reducedsleep efficiency of 79.4%. The latency to sleep onset was within normal limitsat 15.3 minutes. The R sleep onset latency was within normal limits at 106.5 minutes. Sleep parameters, as a percentage of the total sleep time, demonstrated 17.7% of sleep was in N1 sleep, 43.8% N2, 26.3% N3 and 12.2% R sleep. There were a total of 169 arousals for an arousal index of 34.4 arousals per hour of sleep that was elevated.  Respiratory monitoring demonstrated nearly continuous moderate to severe degree of snoring in all positions. There were 14 apneas and hypopneas for an Apnea Hypopnea Index of 2.9 apneas and hypopneas per hour of sleep. The REM related apnea hypopnea index was 11.7/hr of REM sleep compared to a NREM AHI of 1.6/hr.  with all REM occurring in the supine position. The average  duration of the respiratory events was 28.9 seconds with a maximum duration of 43.5 seconds. The respiratory events were associated with peripheral oxygen desaturations on the average to 94%. The lowest oxygen desaturation associated with a respiratory event was 92%. Additionally, the baseline oxygen saturation during wakefulness was 97%, during NREM sleep averaged 96%, and during REM sleep averaged  96%. The total duration of oxygen < 90% was 0.0 minutes.  Cardiac monitoring- did not demonstrate transient cardiac decelerations associated with the apneas. Intermittent PVCs were observed.   Periodic limb movement monitoring- demonstrated that there were 21 periodic limb movements for a periodic limb movement index of 4.3 periodic limb movements per hour of sleep.     Impression: This routine overnight polysomnogram demonstrated REM dependent obstructive sleep apnea with an Apnea Hypopnea Index of 11.7 apneas and hypopneas during REM sleep. However, ad REM percentage was reduced, the overall Apnea Hypopnea Index was low, 2.9/hr. In addition, the patient was on supplemental oxygen during the study which may have caused an underscoring of possible hypopneas.  There were few periodic limb movements that commonly are not significant. Clinical correlation would be suggested.   There was a reduced sleep efficiency with an elevated arousal index,increased awakeningsand a reduced REM percentage.  Recommendations:     Would recommend a repeat study after a night of sleep restriction  to help increase REM pressure. In addition supplemental oxygen should not be utilized unless baseline oxygen is below 80%. Weight loss is recommended in a patient with a BMI of 27.4.    Allyne Gee, MD, [facility] Diplomate ABMS-Pulmonary, Critical Care and Sleep Medicine  Electronically reviewed and digitally signed  [place] Report Part II  Phone: [phone] Fax: [phone]  Patient  last name Cooke Neck Size 16.0 in. Acquisition [phone]  Patient first name Dennis Weight 175.0 lbs. Started 05/12/2022 at 12:51:03 AM  Birth date August 02, 1951 Height 67.0 in. Stopped 05/12/2022 at 8:12:21 AM  Age 36 BMI 27.4 lb/in2 Duration 370.7  Study Type Adult      Report generated by Paulo Fruit., RPSGT  Reviewed by: Richelle Ito. Henke, PhD, ABSM, FAASM Sleep Data: Lights Out: 12:59:45 AM Sleep Onset: 1:15:03 AM  Lights On: 7:10:27 AM Sleep Efficiency: 79.4 %  Total Recording Time: 370.7 min Sleep Latency (from Lights Off) 15.3 min  Total Sleep Time (TST): 294.5 min R Latency (from Sleep Onset): 106.5 min  Sleep Period Time: 355.0 min Total number of awakenings: 22  Wake during sleep: 60.5 min Wake After Sleep Onset (WASO): 60.9 min   Sleep Data:         Arousal Summary: Stage  Latency from lights out (min) Latency from sleep onset (min) Duration (min) % Total Sleep Time  Normal values  N 1 15.3 0.0 52.0 17.7 (5%)  N 2 19.8 4.5 129.0 43.8 (50%)  N 3 54.8 39.5 77.5 26.3 (20%)  R 121.8 106.5 36.0 12.2 (25%)    Number Index  Spontaneous 137 27.9  Apneas & Hypopneas 11 2.2  RERAs 0 0.0       (Apneas & Hypopneas & RERAs)  (11) (2.2)  Limb Movement 21 4.3  Snore 0 0.0  TOTAL 169 34.4     Respiratory Data:  CA OA MA Apnea Hypopnea* A+ H RERA Total  Number 0 0 '5 5 9 14 '$ 0 14  Mean Dur (sec) 0.0 0.0 25.7 25.7 30.6 28.9 0.0 28.9  Max Dur (sec) 0.0 0.0 34.5 34.5 43.5 43.5 0.0 43.5  Total Dur (min) 0.0 0.0 2.1 2.1 4.6 6.7 0.0 6.7  % of TST 0.0 0.0 0.7 0.7 1.6 2.3 0.0 2.3  Index (#/h TST) 0.0 0.0 1.0 1.0 1.8 2.9 0.0 2.9  *Hypopneas scored based on 4% or greater desaturation.  Sleep Stage:        REM NREM TST  AHI 11.7 1.6 2.9  RDI 11.7 1.6 2.9           Body Position Data:  Sleep (min) TST (%) REM (min) NREM (min) CA (#) OA (#) MA (#) HYP (#) AHI (#/h) RERA (#) RDI (#/h) Desat (#)  Supine 198.0 67.23 19.0 179.0 0 0 5 8 3.9 0 3.9 13  Non-Supine 96.50 32.77 17.00 79.50  0.00 0.00 0.00 1.00 0.62 0 0.62 2.00  Left: 96.5 32.77 17.0 79.5 0 0 0 1 0.6 0 0.6 2     Snoring: Total number of snoring episodes  0  Total time with snoring    min (   % of sleep)   Oximetry Distribution:             WK REM NREM TOTAL  Average (%)   97 96 96 96  < 90% 0.0 0.0 0.0 0.0  < 80% 0.0 0.0 0.0 0.0  < 70% 0.0 0.0 0.0 0.0  # of Desaturations* 1 3  4 8  Desat Index (#/hour) 0.9 5.0 0.9 1.6  Desat Max (%) '4 5 5 5  '$ Desat Max Dur (sec) 113.0 38.0 84.0 113.0  Approx Min O2 during sleep 91  Approx min O2 during a respiratory event 92  Was Oxygen added (Y/N) and final rate No:   0 LPM  *Desaturations based on 4% or greater drop from baseline.   Cheyne Stokes Breathing: None Present   Heart Rate Summary:  Average Heart Rate During Sleep 16.0 bpm      Highest Heart Rate During Sleep (95th %) 35.0 bpm      Highest Heart Rate During Sleep 255 bpm (artifact)  Highest Heart Rate During Recording (TIB) 255 bpm (artifact)   Heart Rate Observations: Event Type # Events   Bradycardia 0 Lowest HR Scored: N/A  Sinus Tachycardia During Sleep 0 Highest HR Scored: N/A  Narrow Complex Tachycardia 0 Highest HR Scored: N/A  Wide Complex Tachycardia 0 Highest HR Scored: N/A  Asystole 0 Longest Pause: N/A  Atrial Fibrillation 0 Duration Longest Event: N/A  Other Arrythmias  No Type:    Periodic Limb Movement Data: (Primary legs unless otherwise noted) Total # Limb Movement 48 Limb Movement Index 9.8  Total # PLMS 21 PLMS Index 4.3  Total # PLMS Arousals 6 PLMS Arousal Index 1.2  Percentage Sleep Time with PLMS 11.30mn (3.8 % sleep)  Mean Duration limb movements (secs) 224.8

## 2022-06-03 DIAGNOSIS — J449 Chronic obstructive pulmonary disease, unspecified: Secondary | ICD-10-CM | POA: Diagnosis not present

## 2022-06-03 DIAGNOSIS — D649 Anemia, unspecified: Secondary | ICD-10-CM | POA: Diagnosis not present

## 2022-06-03 DIAGNOSIS — C61 Malignant neoplasm of prostate: Secondary | ICD-10-CM | POA: Diagnosis not present

## 2022-06-03 DIAGNOSIS — I10 Essential (primary) hypertension: Secondary | ICD-10-CM | POA: Diagnosis not present

## 2022-06-04 ENCOUNTER — Other Ambulatory Visit: Payer: Self-pay | Admitting: Internal Medicine

## 2022-06-04 MED ORDER — SPIRIVA HANDIHALER 18 MCG IN CAPS: ORAL_CAPSULE | RESPIRATORY_TRACT | 1 refills | Status: DC

## 2022-06-11 DIAGNOSIS — J449 Chronic obstructive pulmonary disease, unspecified: Secondary | ICD-10-CM | POA: Diagnosis not present

## 2022-06-19 LAB — COLOGUARD

## 2022-07-05 ENCOUNTER — Ambulatory Visit (INDEPENDENT_AMBULATORY_CARE_PROVIDER_SITE_OTHER): Payer: PPO | Admitting: Internal Medicine

## 2022-07-05 ENCOUNTER — Encounter: Payer: Self-pay | Admitting: Internal Medicine

## 2022-07-05 ENCOUNTER — Telehealth: Payer: Self-pay

## 2022-07-05 VITALS — BP 146/72 | HR 93 | Temp 98.8°F | Resp 16 | Ht 67.0 in | Wt 178.4 lb

## 2022-07-05 DIAGNOSIS — J449 Chronic obstructive pulmonary disease, unspecified: Secondary | ICD-10-CM

## 2022-07-05 DIAGNOSIS — R0602 Shortness of breath: Secondary | ICD-10-CM

## 2022-07-05 DIAGNOSIS — G4719 Other hypersomnia: Secondary | ICD-10-CM | POA: Diagnosis not present

## 2022-07-05 DIAGNOSIS — G4734 Idiopathic sleep related nonobstructive alveolar hypoventilation: Secondary | ICD-10-CM | POA: Diagnosis not present

## 2022-07-05 NOTE — Progress Notes (Signed)
[facility] [place], [place] [id number]  Pulmonary Sleep Medicine   Office Visit Note  Patient Name: [name] DOB: 1951/10/26 MRN [id number]  Date of Service: 07/05/2022  Complaints/HPI: Just recently started going to PCP at [facility]. States started on BP meds other than that his breathig has been under good control. Denies having chest pain no palpitations. Not on oxygen at this time during the day but he does use it at night. Patient is symbicort and albuterol at this time  ROS  General: (-) fever, (-) chills, (-) night sweats, (-) weakness Skin: (-) rashes, (-) itching,. Eyes: (-) visual changes, (-) redness, (-) itching. Nose and Sinuses: (-) nasal stuffiness or itchiness, (-) postnasal drip, (-) nosebleeds, (-) sinus trouble. Mouth and Throat: (-) sore throat, (-) hoarseness. Neck: (-) swollen glands, (-) enlarged thyroid, (-) neck pain. Respiratory: -+ cough, (-) bloody sputum, + shortness of breath, - wheezing. Cardiovascular: - ankle swelling, (-) chest pain. Lymphatic: (-) lymph node enlargement. Neurologic: (-) numbness, (-) tingling. Psychiatric: (-) anxiety, (-) depression   Current Medication: Outpatient Encounter Medications as of 07/05/2022  Medication Sig   albuterol (PROVENTIL) (2.5 MG/3ML) 0.083% nebulizer solution Take 3 mLs (2.5 mg total) by nebulization every 6 (six) hours as needed for wheezing or shortness of breath.   albuterol (VENTOLIN HFA) 108 (90 Base) MCG/ACT inhaler INHALE 2 PUFFS BY MOUTH EVERY 4 TO 6 HOURS AS NEEDED   budesonide-formoterol (SYMBICORT) 160-4.5 MCG/ACT inhaler TAKE 2 PUFFS BY MOUTH TWICE A DAY   diclofenac (VOLTAREN) 75 MG EC tablet Take 75 mg by mouth 2 (two) times daily.   OXYGEN Inhale into the lungs. 2 litre at night AHP   tiotropium (SPIRIVA HANDIHALER) 18 MCG inhalation capsule Inhale 1 capsule VIA HANDIHALER once daily at the same time every day strength: 18 mcg   meloxicam (MOBIC) 15 MG tablet  meloxicam 15 mg tablet  TAKE 1 TABLET BY MOUTH EVERY DAY (Patient not taking: Reported on 07/05/2022)   No facility-administered encounter medications on file as of 07/05/2022.    Surgical History: Past Surgical History:  Procedure Laterality Date   SPINE SURGERY     6 in butterbliss cyst on base of spine 1977    Medical History: Past Medical History:  Diagnosis Date   Anxiety    Asthma    COPD (chronic obstructive pulmonary disease) ([place])    Environmental allergies    Sinusitis     Family History: Family History  Problem Relation Age of Onset   Colon cancer Mother    Hypertension Father     Social History: Social History   Socioeconomic History   Marital status: Single    Spouse name: Not on file   Number of children: Not on file   Years of education: Not on file   Highest education level: Not on file  Occupational History   Not on file  Tobacco Use   Smoking status: Former   Smokeless tobacco: Never  Substance and Sexual Activity   Alcohol use: Not Currently    Comment: occational   Drug use: No   Sexual activity: Not on file  Other Topics Concern   Not on file  Social History Narrative   Not on file   Social Determinants of Health   Financial Resource Strain: Not on file  Food Insecurity: Not on file  Transportation Needs: Not on file  Physical Activity: Not on file  Stress: Not on file  Social Connections: Not on  file  Intimate Partner Violence: Not on file    Vital Signs: Blood pressure (!) 146/72, pulse 93, temperature 98.8 F (37.1 C), resp. rate 16, height '5\' 7"'$  (1.702 m), weight 178 lb 6.4 oz (80.9 kg), SpO2 96 %.  Examination: General Appearance: The patient is well-developed, well-nourished, and in no distress. Skin: Gross inspection of skin unremarkable. Head: normocephalic, no gross deformities. Eyes: no gross deformities noted. ENT: ears appear grossly normal no exudates. Neck: Supple. No thyromegaly. No LAD. Respiratory: no  rhonchi noted. Cardiovascular: Normal S1 and S2 without murmur or rub. Extremities: No cyanosis. pulses are equal. Neurologic: Alert and oriented. No involuntary movements.  LABS: No results found for this or any previous visit (from the past 2160 hour(s)).  Radiology: DG Chest 2 View  Result Date: 02/08/2019 CLINICAL DATA:  71 y/o  M; chest pain and productive cough. EXAM: CHEST - 2 VIEW COMPARISON:  07/14/2018 chest radiograph FINDINGS: Stable heart size and mediastinal contours are within normal limits. Both lungs are clear. The visualized skeletal structures are unremarkable. IMPRESSION: No acute pulmonary process identified. Electronically Signed   By: Kristine Garbe M.D.   On: 02/08/2019 15:07    No results found.  No results found.    Assessment and Plan: Patient Active Problem List   Diagnosis Date Noted   COPD (chronic obstructive pulmonary disease) ([place]) 01/10/2018   Emphysema, unspecified ([place]) 01/10/2018   Shortness of breath 01/10/2018   Prostate cancer ([place]) 12/21/2016   Elevated PSA 12/02/2016   1. Chronic obstructive pulmonary disease, unspecified COPD type ([place]) Uses the inhalers as needed they do help him. He has some SOB with exertion. He is not smoking  2. Shortness of breath Doing well as long as he uses the inhalers  3. Nocturnal hypoxia On oxygen at night will continue   4. Hypersomnia Noted to have nocturnal desaturations and REM related increase in OSA with an AHI of 11.7 will need a PSG with sleep restriction. I have explained this to him and he is willing to try again on the PSG   General Counseling: I have discussed the findings of the evaluation and examination with [name].  I have also discussed any further diagnostic evaluation thatmay be needed or ordered today. [name] verbalizes understanding of the findings of todays visit. We also reviewed his medications today and discussed drug interactions and side effects including but not limited  excessive drowsiness and altered mental states. We also discussed that there is always a risk not just to him but also people around him. he has been encouraged to call the office with any questions or concerns that should arise related to todays visit.  No orders of the defined types were placed in this encounter.    Time spent: 38  I have personally obtained a history, examined the patient, evaluated laboratory and imaging results, formulated the assessment and plan and placed orders.    Allyne Gee, MD [facility] Pulmonary and Critical Care Sleep medicine

## 2022-07-05 NOTE — Patient Instructions (Signed)
Chronic Obstructive Pulmonary Disease  Chronic obstructive pulmonary disease (COPD) is a long-term (chronic) lung problem. When you have COPD, it is hard for air to get in and out of your lungs. Usually the condition gets worse over time, and your lungs will never return to normal. There are things you can do to keep yourself as healthy as possible. What are the causes? Smoking. This is the most common cause. Certain genes passed from parent to child (inherited). What increases the risk? Being exposed to secondhand smoke from cigarettes, pipes, or cigars. Being exposed to chemicals and other irritants, such as fumes and dust in the work environment. Having chronic lung conditions or infections. What are the signs or symptoms? Shortness of breath, especially during physical activity. A long-term cough with a large amount of thick mucus. Sometimes, the cough may not have any mucus (dry cough). Wheezing. Breathing quickly. Skin that looks gray or blue, especially in the fingers, toes, or lips. Feeling tired (fatigue). Weight loss. Chest tightness. Having infections often. Episodes when breathing symptoms become much worse (exacerbations). At the later stages of this disease, you may have swelling in the ankles, feet, or legs. How is this treated? Taking medicines. Quitting smoking, if you smoke. Rehabilitation. This includes steps to make your body work better. It may involve a team of specialists. Doing exercises. Making changes to your diet. Using oxygen. Lung surgery. Lung transplant. Comfort measures (palliative care). Follow these instructions at home: Medicines Take over-the-counter and prescription medicines only as told by your doctor. Talk to your doctor before taking any cough or allergy medicines. You may need to avoid medicines that cause your lungs to be dry. Lifestyle If you smoke, stop smoking. Smoking makes the problem worse. Do not smoke or use any products that  contain nicotine or tobacco. If you need help quitting, ask your doctor. Avoid being around things that make your breathing worse. This may include smoke, chemicals, and fumes. Stay active, but remember to rest as well. Learn and use tips on how to manage stress and control your breathing. Make sure you get enough sleep. Most adults need at least 7 hours of sleep every night. Eat healthy foods. Eat smaller meals more often. Rest before meals. Controlled breathing Learn and use tips on how to control your breathing as told by your doctor. Try: Breathing in (inhaling) through your nose for 1 second. Then, pucker your lips and breath out (exhale) through your lips for 2 seconds. Putting one hand on your belly (abdomen). Breathe in slowly through your nose for 1 second. Your hand on your belly should move out. Pucker your lips and breathe out slowly through your lips. Your hand on your belly should move in as you breathe out.  Controlled coughing Learn and use controlled coughing to clear mucus from your lungs. Follow these steps: Lean your head a little forward. Breathe in deeply. Try to hold your breath for 3 seconds. Keep your mouth slightly open while coughing 2 times. Spit any mucus out into a tissue. Rest and do the steps again 1 or 2 times as needed. General instructions Make sure you get all the shots (vaccines) that your doctor recommends. Ask your doctor about a flu shot and a pneumonia shot. Use oxygen therapy and pulmonary rehabilitation if told by your doctor. If you need home oxygen therapy, ask your doctor if you should buy a tool to measure your oxygen level (oximeter). Make a COPD action plan with your doctor. This helps you   to know what to do if you feel worse than usual. Manage any other conditions you have as told by your doctor. Avoid going outside when it is very hot, cold, or humid. Avoid people who have a sickness you can catch (contagious). Keep all follow-up  visits. Contact a doctor if: You cough up more mucus than usual. There is a change in the color or thickness of the mucus. It is harder to breathe than usual. Your breathing is faster than usual. You have trouble sleeping. You need to use your medicines more often than usual. You have trouble doing your normal activities such as getting dressed or walking around the house. Get help right away if: You have shortness of breath while resting. You have shortness of breath that stops you from: Being able to talk. Doing normal activities. Your chest hurts for longer than 5 minutes. Your skin color is more blue than usual. Your pulse oximeter shows that you have low oxygen for longer than 5 minutes. You have a fever. You feel too tired to breathe normally. These symptoms may represent a serious problem that is an emergency. Do not wait to see if the symptoms will go away. Get medical help right away. Call your local emergency services (911 in the [place] Do not drive yourself to the hospital. Summary Chronic obstructive pulmonary disease (COPD) is a long-term lung problem. The way your lungs work will never return to normal. Usually the condition gets worse over time. There are things you can do to keep yourself as healthy as possible. Take over-the-counter and prescription medicines only as told by your doctor. If you smoke, stop. Smoking makes the problem worse. This information is not intended to replace advice given to you by your health care provider. Make sure you discuss any questions you have with your health care provider. Document Revised: 10/07/2020 Document Reviewed: 10/07/2020 Elsevier Patient Education  2023 [employer] Inc.  

## 2022-07-05 NOTE — Telephone Encounter (Signed)
SS order placed in [facility] folder-Toni 

## 2022-07-07 DIAGNOSIS — J449 Chronic obstructive pulmonary disease, unspecified: Secondary | ICD-10-CM | POA: Diagnosis not present

## 2022-07-07 DIAGNOSIS — D649 Anemia, unspecified: Secondary | ICD-10-CM | POA: Diagnosis not present

## 2022-07-07 DIAGNOSIS — C61 Malignant neoplasm of prostate: Secondary | ICD-10-CM | POA: Diagnosis not present

## 2022-07-07 DIAGNOSIS — I10 Essential (primary) hypertension: Secondary | ICD-10-CM | POA: Diagnosis not present

## 2022-07-12 DIAGNOSIS — J449 Chronic obstructive pulmonary disease, unspecified: Secondary | ICD-10-CM | POA: Diagnosis not present

## 2022-07-29 ENCOUNTER — Telehealth: Payer: Self-pay

## 2022-07-29 NOTE — Telephone Encounter (Signed)
Patient was a no show/cancelled on 6/24; 6/30 and 06/23/22 for sleep study.tat

## 2022-08-09 ENCOUNTER — Telehealth: Payer: Self-pay

## 2022-08-09 NOTE — Telephone Encounter (Signed)
Patient missed 06/23/22 SS-Toni

## 2022-08-12 DIAGNOSIS — J449 Chronic obstructive pulmonary disease, unspecified: Secondary | ICD-10-CM | POA: Diagnosis not present

## 2022-09-03 ENCOUNTER — Other Ambulatory Visit: Payer: Self-pay | Admitting: Internal Medicine

## 2022-09-03 DIAGNOSIS — J449 Chronic obstructive pulmonary disease, unspecified: Secondary | ICD-10-CM

## 2022-09-11 DIAGNOSIS — J449 Chronic obstructive pulmonary disease, unspecified: Secondary | ICD-10-CM | POA: Diagnosis not present

## 2022-09-27 ENCOUNTER — Telehealth: Payer: Self-pay | Admitting: Internal Medicine

## 2022-09-27 ENCOUNTER — Encounter: Payer: Self-pay | Admitting: Internal Medicine

## 2022-09-27 ENCOUNTER — Ambulatory Visit (INDEPENDENT_AMBULATORY_CARE_PROVIDER_SITE_OTHER): Payer: PPO | Admitting: Internal Medicine

## 2022-09-27 VITALS — BP 150/101 | HR 62 | Temp 98.2°F | Resp 16 | Ht 67.0 in | Wt 177.2 lb

## 2022-09-27 DIAGNOSIS — J449 Chronic obstructive pulmonary disease, unspecified: Secondary | ICD-10-CM | POA: Diagnosis not present

## 2022-09-27 DIAGNOSIS — G4734 Idiopathic sleep related nonobstructive alveolar hypoventilation: Secondary | ICD-10-CM

## 2022-09-27 DIAGNOSIS — G4719 Other hypersomnia: Secondary | ICD-10-CM | POA: Diagnosis not present

## 2022-09-27 NOTE — Telephone Encounter (Signed)
Awaiting 09/27/22 office notes for SS order-Toni

## 2022-09-27 NOTE — Patient Instructions (Signed)

## 2022-09-27 NOTE — Telephone Encounter (Signed)
SS ordered. Patient previously scheduled 06/23/22, but patient missed appointment. Cindy w/ [facility], she will check to see if new order is needed-Toni

## 2022-09-27 NOTE — Progress Notes (Signed)
[facility] [place], [place] [address]  Pulmonary Sleep Medicine   Office Visit Note  Patient Name: [name] DOB: 12-13-1951 MRN [id number]  Date of Service: 09/27/2022  Complaints/HPI: He is doing OK. Unfortunately he did not have a sleep study done. He states he treied to reschedule but was not able to do so. Patient states he has no cough no congestion. His breathing is at baseline. Notes a little issue with the cooler weather. Patient states is is now doing better. He is using his inhalers as prescribed.  ROS  General: (-) fever, (-) chills, (-) night sweats, (-) weakness Skin: (-) rashes, (-) itching,. Eyes: (-) visual changes, (-) redness, (-) itching. Nose and Sinuses: (-) nasal stuffiness or itchiness, (-) postnasal drip, (-) nosebleeds, (-) sinus trouble. Mouth and Throat: (-) sore throat, (-) hoarseness. Neck: (-) swollen glands, (-) enlarged thyroid, (-) neck pain. Respiratory: - cough, (-) bloody sputum, + shortness of breath, - wheezing. Cardiovascular: - ankle swelling, (-) chest pain. Lymphatic: (-) lymph node enlargement. Neurologic: (-) numbness, (-) tingling. Psychiatric: (-) anxiety, (-) depression   Current Medication: Outpatient Encounter Medications as of 09/27/2022  Medication Sig   albuterol (PROVENTIL) (2.5 MG/3ML) 0.083% nebulizer solution Take 3 mLs (2.5 mg total) by nebulization every 6 (six) hours as needed for wheezing or shortness of breath.   albuterol (VENTOLIN HFA) 108 (90 Base) MCG/ACT inhaler TAKE 2 PUFFS BY MOUTH EVERY 4 TO 6 HOURS AS NEEDED   budesonide-formoterol (SYMBICORT) 160-4.5 MCG/ACT inhaler TAKE 2 PUFFS BY MOUTH TWICE A DAY   diclofenac (VOLTAREN) 75 MG EC tablet Take 75 mg by mouth 2 (two) times daily.   meloxicam (MOBIC) 15 MG tablet    OXYGEN Inhale into the lungs. 2 litre at night AHP   tiotropium (SPIRIVA HANDIHALER) 18 MCG inhalation capsule Inhale 1 capsule VIA HANDIHALER once daily at the  same time every day strength: 18 mcg   No facility-administered encounter medications on file as of 09/27/2022.    Surgical History: Past Surgical History:  Procedure Laterality Date   SPINE SURGERY     7 in butterbliss cyst on base of spine 1977    Medical History: Past Medical History:  Diagnosis Date   Anxiety    Asthma    COPD (chronic obstructive pulmonary disease) ([place])    Environmental allergies    Sinusitis     Family History: Family History  Problem Relation Age of Onset   Colon cancer Mother    Hypertension Father     Social History: Social History   Socioeconomic History   Marital status: Single    Spouse name: Not on file   Number of children: Not on file   Years of education: Not on file   Highest education level: Not on file  Occupational History   Not on file  Tobacco Use   Smoking status: Former   Smokeless tobacco: Never  Substance and Sexual Activity   Alcohol use: Not Currently    Comment: occational   Drug use: No   Sexual activity: Not on file  Other Topics Concern   Not on file  Social History Narrative   Not on file   Social Determinants of Health   Financial Resource Strain: Not on file  Food Insecurity: Not on file  Transportation Needs: Not on file  Physical Activity: Not on file  Stress: Not on file  Social Connections: Not on file  Intimate Partner Violence: Not on file  Vital Signs: Blood pressure (!) 150/101, pulse 62, temperature 98.2 F (36.8 C), resp. rate 16, height '5\' 7"'$  (1.702 m), weight 177 lb 3.2 oz (80.4 kg), SpO2 95 %.  Examination: General Appearance: The patient is well-developed, well-nourished, and in no distress. Skin: Gross inspection of skin unremarkable. Head: normocephalic, no gross deformities. Eyes: no gross deformities noted. ENT: ears appear grossly normal no exudates. Neck: Supple. No thyromegaly. No LAD. Respiratory: no rhonchi noted. Cardiovascular: Normal S1 and S2 without murmur or  rub. Extremities: No cyanosis. pulses are equal. Neurologic: Alert and oriented. No involuntary movements.  LABS: No results found for this or any previous visit (from the past 2160 hour(s)).  Radiology: DG Chest 2 View  Result Date: 02/08/2019 CLINICAL DATA:  71 y/o  M; chest pain and productive cough. EXAM: CHEST - 2 VIEW COMPARISON:  07/14/2018 chest radiograph FINDINGS: Stable heart size and mediastinal contours are within normal limits. Both lungs are clear. The visualized skeletal structures are unremarkable. IMPRESSION: No acute pulmonary process identified. Electronically Signed   By: Kristine Garbe M.D.   On: 02/08/2019 15:07    No results found.  No results found.    Assessment and Plan: Patient Active Problem List   Diagnosis Date Noted   COPD (chronic obstructive pulmonary disease) ([place]) 01/10/2018   Emphysema, unspecified ([place]) 01/10/2018   Shortness of breath 01/10/2018   Prostate cancer ([place]) 12/21/2016   Elevated PSA 12/02/2016    1. Chronic obstructive pulmonary disease, unspecified COPD type ([place]) Appears to be relatively stable we will continue with current medical management and inhaler therapy.  2. Other hypersomnia Has significant daytime somnolence sleepiness with some issues with nocturnal desaturation also.  Has not had a sleep study done although he had been scheduled for a PSG  3. Nocturnal hypoxia PSG scheduled for further evaluation may very well have overlap syndrome with underlying COPD  General Counseling: I have discussed the findings of the evaluation and examination with [name].  I have also discussed any further diagnostic evaluation thatmay be needed or ordered today. Cornel verbalizes understanding of the findings of todays visit. We also reviewed his medications today and discussed drug interactions and side effects including but not limited excessive drowsiness and altered mental states. We also discussed that there is always a risk  not just to him but also people around him. he has been encouraged to call the office with any questions or concerns that should arise related to todays visit.  No orders of the defined types were placed in this encounter.    Time spent: 69  I have personally obtained a history, examined the patient, evaluated laboratory and imaging results, formulated the assessment and plan and placed orders.    Allyne Gee, MD [facility] Pulmonary and Critical Care Sleep medicine

## 2022-10-07 DIAGNOSIS — J449 Chronic obstructive pulmonary disease, unspecified: Secondary | ICD-10-CM | POA: Diagnosis not present

## 2022-10-07 DIAGNOSIS — Z23 Encounter for immunization: Secondary | ICD-10-CM | POA: Diagnosis not present

## 2022-10-07 DIAGNOSIS — C61 Malignant neoplasm of prostate: Secondary | ICD-10-CM | POA: Diagnosis not present

## 2022-10-07 DIAGNOSIS — D649 Anemia, unspecified: Secondary | ICD-10-CM | POA: Diagnosis not present

## 2022-10-07 DIAGNOSIS — I10 Essential (primary) hypertension: Secondary | ICD-10-CM | POA: Diagnosis not present

## 2022-10-07 DIAGNOSIS — Z1212 Encounter for screening for malignant neoplasm of rectum: Secondary | ICD-10-CM | POA: Diagnosis not present

## 2022-10-07 DIAGNOSIS — M179 Osteoarthritis of knee, unspecified: Secondary | ICD-10-CM | POA: Diagnosis not present

## 2022-10-07 DIAGNOSIS — Z1211 Encounter for screening for malignant neoplasm of colon: Secondary | ICD-10-CM | POA: Diagnosis not present

## 2022-10-07 DIAGNOSIS — Z0000 Encounter for general adult medical examination without abnormal findings: Secondary | ICD-10-CM | POA: Diagnosis not present

## 2022-10-08 DIAGNOSIS — I10 Essential (primary) hypertension: Secondary | ICD-10-CM | POA: Diagnosis not present

## 2022-10-08 DIAGNOSIS — C61 Malignant neoplasm of prostate: Secondary | ICD-10-CM | POA: Diagnosis not present

## 2022-10-08 DIAGNOSIS — J449 Chronic obstructive pulmonary disease, unspecified: Secondary | ICD-10-CM | POA: Diagnosis not present

## 2022-10-08 DIAGNOSIS — D649 Anemia, unspecified: Secondary | ICD-10-CM | POA: Diagnosis not present

## 2022-10-08 DIAGNOSIS — M179 Osteoarthritis of knee, unspecified: Secondary | ICD-10-CM | POA: Diagnosis not present

## 2022-10-11 ENCOUNTER — Telehealth: Payer: Self-pay | Admitting: Internal Medicine

## 2022-10-11 NOTE — Telephone Encounter (Signed)
SS order placed in [facility] folder-Toni 

## 2022-10-12 DIAGNOSIS — J449 Chronic obstructive pulmonary disease, unspecified: Secondary | ICD-10-CM | POA: Diagnosis not present

## 2022-10-27 ENCOUNTER — Telehealth: Payer: Self-pay | Admitting: Internal Medicine

## 2022-10-27 NOTE — Telephone Encounter (Signed)
Patient scheduled for psg on 11/10/[email or web address]@ [email or web address]

## 2022-11-10 ENCOUNTER — Encounter (INDEPENDENT_AMBULATORY_CARE_PROVIDER_SITE_OTHER): Payer: PPO | Admitting: Internal Medicine

## 2022-11-10 DIAGNOSIS — G4719 Other hypersomnia: Secondary | ICD-10-CM | POA: Diagnosis not present

## 2022-11-11 DIAGNOSIS — J449 Chronic obstructive pulmonary disease, unspecified: Secondary | ICD-10-CM | POA: Diagnosis not present

## 2022-11-16 NOTE — Procedures (Signed)
[facility] Report Part I                                                                 Phone: [phone] Fax: [phone]  Patient Name: [name], [name]. Acquisition Number: [id number]  Date of Birth: 1951-06-06 Acquisition Date: 11/10/2022  Referring Physician: Allyne Gee MD     History: The patient is a 71 year old male who was referred for re-evaluation of possible sleep apnea with snoring and sleepiness. Medical History: anxiety, asthma, COPD, enviromental allergies, shortness of breath, emphysema, hypersomia and sinustis.  Medications: Albuterol (Proventil), Albuterol (Ventolin HFA), Symbicort, Mobic, 2L of O2 and Spiriva  Procedure: This routine overnight polysomnogram was performed on the [facility] 5 using the standard diagnostic protocol. This included 6 channels of EEG, 2 channels of EOG, chin EMG, bilateral anterior tibialis EMG, nasal/oral thermistor, PTAF (nasal pressure transducer), chest and abdominal wall movements, EKG, and pulse oximetry.  Description: The total recording time was 430.0 minutes. The total sleep time was 361.0 minutes. There were a total of 29.5 minutes of wakefulness after sleep onset for a reducedsleep efficiency of 84.0%. The latency to sleep onset was prolonged at 39.5 minutes. The R sleep onset latency was within normal limits at 77.5 minutes. Sleep parameters, as a percentage of the total sleep time, demonstrated 6.0% of sleep was in N1 sleep, 68.8% N2, 0.1% N3 and 25.1% R sleep. There were a total of 61 arousals for an arousal index of 10.1 arousals per hour of sleep that was normal.  Respiratory monitoring demonstrated nearly continuous moderate to severe degree of snoring in all positions. There were 17 apneas and hypopneas for an Apnea Hypopnea Index of 2.8 apneas and hypopneas per hour of sleep. The REM related apnea hypopnea index was 9.9/hr of REM sleep compared to a NREM AHI of 0.4/hr. All REM occurred in the left,  lateral position.  The average duration of the respiratory events was 20.3 seconds with a maximum duration of 36.0 seconds. The respiratory events were associated with peripheral oxygen desaturations on the average to 88%. The lowest oxygen desaturation associated with a respiratory event was 86%. Additionally, the baseline oxygen saturation during wakefulness was 93%, during NREM sleep averaged 92%, and during REM sleep averaged 91%. The total duration of oxygen < 90% was 20.5 minutes and <80% was 0.0 minutes. Supplemental oxygen was not utilized during the study.  Cardiac monitoring- did not demonstrate transient cardiac decelerations associated with the apneas. Intermittent PVCs were observed.   Periodic limb movement monitoring- demonstrated that there were 162 periodic limb movements for a periodic limb movement index of 26.9 periodic limb movements per hour of sleep.    Impression: This routine overnight polysomnogram did not demonstrate significant obstructive sleep apnea due to a low Apnea Hypopnea Index of 2.8 apneas and hypopneas per hour.The respiratory events were non-obstructive REM-related hypopneas  with the lowest desaturation to 86%.  Supplemental oxygen was not utilized.  There was a significantly elevated periodic limb movement index of 26.9 periodic limb movements per hour of sleep. Clinical correlation is suggested.  There was a reduced sleep efficiency with a prolonged sleep latency. There was virtually no slow wave sleep.  These findings would appear to be due  to the limb movements.  Recommendations:    Would recommend weight loss in a patient with a BMI of 27.4.     Allyne Gee, MD, [facility] Diplomate ABMS-Pulmonary, Critical Care and Sleep Medicine  Electronically reviewed and digitally signed [facility] Report Part II  Phone: [phone] Fax: [phone]  Patient last name Risdon Neck Size 16.0 in. Acquisition [phone]  Patient  first name [name]. Weight 175.0 lbs. Started 11/10/2022 at 9:41:59 PM  Birth date 06-25-1951 Height 67.0 in. Stopped 11/11/2022 at 5:03:47 AM  Age 71 BMI 27.4 lb/in2 Duration 430.0  Study Type Adult      [place], Margaretmary Eddy  Reviewed by: Richelle Ito. Henke, PhD, ABSM, FAASM Sleep Data: Lights Out: 9:51:29 PM Sleep Onset: 10:30:59 PM  Lights On: 5:01:29 AM Sleep Efficiency: 84.0 %  Total Recording Time: 430.0 min Sleep Latency (from Lights Off) 39.5 min  Total Sleep Time (TST): 361.0 min R Latency (from Sleep Onset): 77.5 min  Sleep Period Time: 390.0 min Total number of awakenings: 13  Wake during sleep: 29.0 min Wake After Sleep Onset (WASO): 29.5 min   Sleep Data:         Arousal Summary: Stage  Latency from lights out (min) Latency from sleep onset (min) Duration (min) % Total Sleep Time  Normal values  N 1 39.5 0.0 21.5 6.0 (5%)  N 2 40.0 0.5 248.5 68.8 (50%)  N 3 91.0 51.5 0.5 0.1 (20%)  R 117.0 77.5 90.5 25.1 (25%)    Number Index  Spontaneous 43 7.1  Apneas & Hypopneas 0 0.0  RERAs 0 0.0       (Apneas & Hypopneas & RERAs)  (0) (0.0)  Limb Movement 18 3.0  Snore 0 0.0  TOTAL 61 10.1     Respiratory Data:  CA OA MA Apnea Hypopnea* A+ H RERA Total  Number 0 0 0 0 17 17 0 17  Mean Dur (sec) 0.0 0.0 0.0 0.0 20.3 20.3 0.0 20.3  Max Dur (sec) 0.0 0.0 0.0 0.0 36.0 36.0 0.0 36.0  Total Dur (min) 0.0 0.0 0.0 0.0 5.8 5.8 0.0 5.8  % of TST 0.0 0.0 0.0 0.0 1.6 1.6 0.0 1.6  Index (#/h TST) 0.0 0.0 0.0 0.0 2.8 2.8 0.0 2.8  *Hypopneas scored based on 4% or greater desaturation.  Sleep Stage:        REM NREM TST  AHI 9.9 0.4 2.8  RDI 9.9 0.4 2.8           Body Position Data:  Sleep (min) TST (%) REM (min) NREM (min) CA (#) OA (#) MA (#) HYP (#) AHI (#/h) RERA (#) RDI (#/h) Desat (#)  Supine 103.0 28.53 0.0 103.0 0 0 0 0 0.0 0 0.00 12  Non-Supine 258.00 71.47 90.50 167.50 0.00 0.00 0.00 17.00 3.95 0 3.95 31.00  Left: 258.0 71.47 90.5 167.5 0 0  0 17 4.0 0 4.0 31     Snoring: Total number of snoring episodes  0  Total time with snoring    min (   % of sleep)   Oximetry Distribution:             WK REM NREM TOTAL  Average (%)   93 91 92 92  < 90% 0.6 10.4 9.5 20.5  < 80% 0.0 0.0 0.0 0.0  < 70% 0.0 0.0 0.0 0.0  # of Desaturations* '1 20 22 '$ 43  Desat Index (#/hour) 0.9 13.3 4.9  7.1  Desat Max (%) '4 6 8 8  '$ Desat Max Dur (sec) 44.0 54.0 113.0 113.0  Approx Min O2 during sleep 86  Approx min O2 during a respiratory event 86  Was Oxygen added (Y/N) and final rate No:   0 LPM  *Desaturations based on 4% or greater drop from baseline.   Cheyne Stokes Breathing: None Present   Heart Rate Summary:  Average Heart Rate During Sleep 57.3 bpm      Highest Heart Rate During Sleep (95th %) 62.0 bpm      Highest Heart Rate During Sleep 191 bpm (artifact)  Highest Heart Rate During Recording (TIB) 217 bpm (artifact)   Heart Rate Observations: Event Type # Events   Bradycardia 0 Lowest HR Scored: N/A  Sinus Tachycardia During Sleep 0 Highest HR Scored: N/A  Narrow Complex Tachycardia 0 Highest HR Scored: N/A  Wide Complex Tachycardia 0 Highest HR Scored: N/A  Asystole 0 Longest Pause: N/A  Atrial Fibrillation 0 Duration Longest Event: N/A  Other Arrythmias  No Type:    Periodic Limb Movement Data: (Primary legs unless otherwise noted) Total # Limb Movement 165 Limb Movement Index 27.4  Total # PLMS 162 PLMS Index 26.9  Total # PLMS Arousals 18 PLMS Arousal Index 3.0  Percentage Sleep Time with PLMS 66.45mn (18.4 % sleep)  Mean Duration limb movements (secs) 399.5

## 2022-12-12 DIAGNOSIS — J449 Chronic obstructive pulmonary disease, unspecified: Secondary | ICD-10-CM | POA: Diagnosis not present

## 2023-01-06 ENCOUNTER — Other Ambulatory Visit: Payer: Self-pay | Admitting: Internal Medicine

## 2023-01-07 ENCOUNTER — Other Ambulatory Visit: Payer: Self-pay | Admitting: Internal Medicine

## 2023-01-07 DIAGNOSIS — J449 Chronic obstructive pulmonary disease, unspecified: Secondary | ICD-10-CM

## 2023-01-12 DIAGNOSIS — J449 Chronic obstructive pulmonary disease, unspecified: Secondary | ICD-10-CM | POA: Diagnosis not present

## 2023-01-25 ENCOUNTER — Telehealth: Payer: Self-pay | Admitting: Internal Medicine

## 2023-01-25 NOTE — Telephone Encounter (Signed)
Lvm to move 03/28/23 appointment-[name]

## 2023-02-03 ENCOUNTER — Telehealth: Payer: Self-pay

## 2023-02-03 ENCOUNTER — Other Ambulatory Visit: Payer: Self-pay | Admitting: Internal Medicine

## 2023-02-03 DIAGNOSIS — J449 Chronic obstructive pulmonary disease, unspecified: Secondary | ICD-10-CM

## 2023-02-03 NOTE — Telephone Encounter (Signed)
Completed P.A. for patient's Symbicort.

## 2023-02-10 DIAGNOSIS — J449 Chronic obstructive pulmonary disease, unspecified: Secondary | ICD-10-CM | POA: Diagnosis not present

## 2023-02-11 DIAGNOSIS — M7022 Olecranon bursitis, left elbow: Secondary | ICD-10-CM | POA: Diagnosis not present

## 2023-02-23 ENCOUNTER — Other Ambulatory Visit: Payer: Self-pay

## 2023-02-23 DIAGNOSIS — R0602 Shortness of breath: Secondary | ICD-10-CM

## 2023-02-25 DIAGNOSIS — M7022 Olecranon bursitis, left elbow: Secondary | ICD-10-CM | POA: Diagnosis not present

## 2023-02-25 DIAGNOSIS — R972 Elevated prostate specific antigen [PSA]: Secondary | ICD-10-CM | POA: Diagnosis not present

## 2023-02-25 DIAGNOSIS — C61 Malignant neoplasm of prostate: Secondary | ICD-10-CM | POA: Diagnosis not present

## 2023-02-25 DIAGNOSIS — Z0000 Encounter for general adult medical examination without abnormal findings: Secondary | ICD-10-CM | POA: Diagnosis not present

## 2023-02-25 DIAGNOSIS — J449 Chronic obstructive pulmonary disease, unspecified: Secondary | ICD-10-CM | POA: Diagnosis not present

## 2023-02-25 DIAGNOSIS — I10 Essential (primary) hypertension: Secondary | ICD-10-CM | POA: Diagnosis not present

## 2023-02-25 DIAGNOSIS — M179 Osteoarthritis of knee, unspecified: Secondary | ICD-10-CM | POA: Diagnosis not present

## 2023-02-25 DIAGNOSIS — D649 Anemia, unspecified: Secondary | ICD-10-CM | POA: Diagnosis not present

## 2023-03-02 ENCOUNTER — Ambulatory Visit (INDEPENDENT_AMBULATORY_CARE_PROVIDER_SITE_OTHER): Payer: PPO | Admitting: Internal Medicine

## 2023-03-02 DIAGNOSIS — R0602 Shortness of breath: Secondary | ICD-10-CM

## 2023-03-10 DIAGNOSIS — M7022 Olecranon bursitis, left elbow: Secondary | ICD-10-CM | POA: Diagnosis not present

## 2023-03-10 DIAGNOSIS — M25522 Pain in left elbow: Secondary | ICD-10-CM | POA: Diagnosis not present

## 2023-03-15 DIAGNOSIS — N1831 Chronic kidney disease, stage 3a: Secondary | ICD-10-CM | POA: Diagnosis not present

## 2023-03-15 DIAGNOSIS — C61 Malignant neoplasm of prostate: Secondary | ICD-10-CM | POA: Diagnosis not present

## 2023-03-15 DIAGNOSIS — M7022 Olecranon bursitis, left elbow: Secondary | ICD-10-CM | POA: Diagnosis not present

## 2023-03-15 DIAGNOSIS — R972 Elevated prostate specific antigen [PSA]: Secondary | ICD-10-CM | POA: Diagnosis not present

## 2023-03-23 ENCOUNTER — Ambulatory Visit: Payer: Self-pay | Admitting: Urology

## 2023-03-28 ENCOUNTER — Ambulatory Visit: Payer: PPO | Admitting: Internal Medicine

## 2023-03-29 ENCOUNTER — Ambulatory Visit: Payer: PPO | Admitting: Internal Medicine

## 2023-04-11 ENCOUNTER — Ambulatory Visit (INDEPENDENT_AMBULATORY_CARE_PROVIDER_SITE_OTHER): Payer: PPO | Admitting: Internal Medicine

## 2023-04-11 ENCOUNTER — Encounter: Payer: Self-pay | Admitting: Internal Medicine

## 2023-04-11 VITALS — BP 139/70 | HR 83 | Temp 98.1°F | Resp 16 | Ht 67.0 in | Wt 172.8 lb

## 2023-04-11 DIAGNOSIS — G4734 Idiopathic sleep related nonobstructive alveolar hypoventilation: Secondary | ICD-10-CM | POA: Diagnosis not present

## 2023-04-11 DIAGNOSIS — J449 Chronic obstructive pulmonary disease, unspecified: Secondary | ICD-10-CM

## 2023-04-11 NOTE — Progress Notes (Signed)
[facility] [address] [place], [place] [address]  Pulmonary Sleep Medicine   Office Visit Note  Patient Name: [name] DOB: Dec 04, 1951 MRN [id number]  Date of Service: 04/11/2023  Complaints/HPI: He has been doing well overall. No admissions to the hospital. Denies having congestion. No coughing noted. Denies having chest congestion. Seasonal allergies are still present. Nasal spray is being used by him also  Office Spirometry Results:     ROS  General: (-) fever, (-) chills, (-) night sweats, (-) weakness Skin: (-) rashes, (-) itching,. Eyes: (-) visual changes, (-) redness, (-) itching. Nose and Sinuses: (-) nasal stuffiness or itchiness, (-) postnasal drip, (-) nosebleeds, (-) sinus trouble. Mouth and Throat: (-) sore throat, (-) hoarseness. Neck: (-) swollen glands, (-) enlarged thyroid, (-) neck pain. Respiratory: - cough, (-) bloody sputum, - shortness of breath, - wheezing. Cardiovascular: - ankle swelling, (-) chest pain. Lymphatic: (-) lymph node enlargement. Neurologic: (-) numbness, (-) tingling. Psychiatric: (-) anxiety, (-) depression   Current Medication: Outpatient Encounter Medications as of 04/11/2023  Medication Sig   albuterol (PROVENTIL) (2.5 MG/3ML) 0.083% nebulizer solution Take 3 mLs (2.5 mg total) by nebulization every 6 (six) hours as needed for wheezing or shortness of breath.   albuterol (VENTOLIN HFA) 108 (90 Base) MCG/ACT inhaler INHALE 2 PUFFS BY MOUTH EVERY 4 TO 6 HOURS AS NEEDED   budesonide-formoterol (SYMBICORT) 160-4.5 MCG/ACT inhaler TAKE 2 PUFFS BY MOUTH TWICE A DAY   diclofenac (VOLTAREN) 75 MG EC tablet Take 75 mg by mouth 2 (two) times daily.   meloxicam (MOBIC) 15 MG tablet    OXYGEN Inhale into the lungs. 2 litre at night AHP   SPIRIVA HANDIHALER 18 MCG inhalation capsule INHALE 1 CAPSULE VIA HANDIHALER ONCE DAILY AT THE SAME TIME EVERY DAY STRENGTH: 18 MCG   No facility-administered encounter medications on  file as of 04/11/2023.    Surgical History: Past Surgical History:  Procedure Laterality Date   SPINE SURGERY     6 in butterbliss cyst on base of spine 1977    Medical History: Past Medical History:  Diagnosis Date   Anxiety    Asthma    COPD (chronic obstructive pulmonary disease) ([facility])    Environmental allergies    Sinusitis     Family History: Family History  Problem Relation Age of Onset   Colon cancer Mother    Hypertension Father     Social History: Social History   Socioeconomic History   Marital status: Single    Spouse name: Not on file   Number of children: Not on file   Years of education: Not on file   Highest education level: Not on file  Occupational History   Not on file  Tobacco Use   Smoking status: Former   Smokeless tobacco: Never  Substance and Sexual Activity   Alcohol use: Not Currently    Comment: occational   Drug use: No   Sexual activity: Not on file  Other Topics Concern   Not on file  Social History Narrative   Not on file   Social Determinants of Health   Financial Resource Strain: Not on file  Food Insecurity: Not on file  Transportation Needs: Not on file  Physical Activity: Not on file  Stress: Not on file  Social Connections: Not on file  Intimate Partner Violence: Not on file    Vital Signs: Blood pressure 139/70, pulse 83, temperature 98.1 F (36.7 C), resp. rate 16, height 5\' 7"  (1.702  m), weight 172 lb 12.8 oz (78.4 kg), SpO2 94 %.  Examination: General Appearance: The patient is well-developed, well-nourished, and in no distress. Skin: Gross inspection of skin unremarkable. Head: normocephalic, no gross deformities. Eyes: no gross deformities noted. ENT: ears appear grossly normal no exudates. Neck: Supple. No thyromegaly. No LAD. Respiratory: no rhonchi noted. Cardiovascular: Normal S1 and S2 without murmur or rub. Extremities: No cyanosis. pulses are equal. Neurologic: Alert and oriented. No  involuntary movements.  LABS: No results found for this or any previous visit (from the past 2160 hour(s)).  Radiology: DG Chest 2 View  Result Date: 02/08/2019 CLINICAL DATA:  72 y/o  M; chest pain and productive cough. EXAM: CHEST - 2 VIEW COMPARISON:  07/14/2018 chest radiograph FINDINGS: Stable heart size and mediastinal contours are within normal limits. Both lungs are clear. The visualized skeletal structures are unremarkable. IMPRESSION: No acute pulmonary process identified. Electronically Signed   By: Mitzi Hansen M.D.   On: 02/08/2019 15:07    No results found.  No results found.  Assessment and Plan: Patient Active Problem List   Diagnosis Date Noted   COPD (chronic obstructive pulmonary disease) ([facility]) 01/10/2018   Emphysema, unspecified ([facility]) 01/10/2018   Shortness of breath 01/10/2018   Prostate cancer ([facility]) 12/21/2016   Elevated PSA 12/02/2016    1. Chronic obstructive pulmonary disease, unspecified COPD type ([facility]) Doing well will continue with current medical therapy appears to betolerating current regimen well.  2. Nocturnal hypoxia On oxgen will be continued currently using 2lpm   General Counseling: I have discussed the findings of the evaluation and examination with Alinda Money.  I have also discussed any further diagnostic evaluation thatmay be needed or ordered today. Mouhamadou verbalizes understanding of the findings of todays visit. We also reviewed his medications today and discussed drug interactions and side effects including but not limited excessive drowsiness and altered mental states. We also discussed that there is always a risk not just to him but also people around him. he has been encouraged to call the office with any questions or concerns that should arise related to todays visit.  No orders of the defined types were placed in this encounter.    Time spent: 1  I have personally obtained a history, examined the patient, evaluated laboratory  and imaging results, formulated the assessment and plan and placed orders.    Yevonne Pax, MD [facility] Pulmonary and Critical Care Sleep medicine

## 2023-04-19 NOTE — Procedures (Signed)
[facility] [address] [place], [address]    Complete Pulmonary Function Testing Interpretation:  FINDINGS:  Forced vital capacity is moderately decreased.  FEV1 is 1.03 L which is 35% of predicted and is severely decreased.  Postbronchodilator no significant improvement in the FEV1.  F1 FVC ratio is severely decreased.  Total lung capacity is normal residual volume is increased FRC is increased.  DLCO is severely decreased.  IMPRESSION:  This pulmonary function study is consistent with severe obstructive lung disease clinical correlation is recommended  Yevonne Pax, MD [facility] Pulmonary Critical Care Medicine Sleep Medicine

## 2023-06-13 LAB — PULMONARY FUNCTION TEST

## 2023-07-06 ENCOUNTER — Other Ambulatory Visit: Payer: Self-pay | Admitting: Internal Medicine

## 2023-07-19 ENCOUNTER — Other Ambulatory Visit: Payer: Self-pay | Admitting: Internal Medicine

## 2023-07-19 DIAGNOSIS — J449 Chronic obstructive pulmonary disease, unspecified: Secondary | ICD-10-CM

## 2023-10-13 ENCOUNTER — Encounter: Payer: Self-pay | Admitting: Physician Assistant

## 2023-10-13 ENCOUNTER — Other Ambulatory Visit: Payer: Self-pay

## 2023-10-13 ENCOUNTER — Ambulatory Visit: Payer: PPO | Admitting: Physician Assistant

## 2023-10-13 VITALS — BP 140/70 | HR 92 | Temp 98.0°F | Resp 16 | Ht 67.0 in | Wt 167.4 lb

## 2023-10-13 DIAGNOSIS — J449 Chronic obstructive pulmonary disease, unspecified: Secondary | ICD-10-CM

## 2023-10-13 DIAGNOSIS — G4734 Idiopathic sleep related nonobstructive alveolar hypoventilation: Secondary | ICD-10-CM | POA: Diagnosis not present

## 2023-10-13 DIAGNOSIS — Z23 Encounter for immunization: Secondary | ICD-10-CM

## 2023-10-13 MED ORDER — ALBUTEROL SULFATE HFA 108 (90 BASE) MCG/ACT IN AERS
INHALATION_SPRAY | RESPIRATORY_TRACT | 3 refills | Status: DC
Start: 2023-10-13 — End: 2024-02-16

## 2023-10-13 MED ORDER — INFLUENZA VAC SPLIT HIGH-DOSE 0.5 ML IM SUSY: 0.5000 mL | PREFILLED_SYRINGE | Freq: Once | INTRAMUSCULAR | 0 refills | Status: AC

## 2023-10-13 NOTE — Progress Notes (Signed)
[facility] [address] [place], [place] [address]  Pulmonary Sleep Medicine   Office Visit Note  Patient Name: [name] DOB: [birth year] MRN [id number]  Date of Service: 10/13/2023  Complaints/HPI: Pt is here for routine follow up. Has been waking up with some nosebleeds, always left side. Not very frequently. Using saline spray, no steroid spray. Does use oxygen at home and does not have humidifier and will try ordering now to see if this helps. Uses AHP. Has been doing well otherwise. Does use albuterol as needed. Needs flu shot and requests order for this. Did states that typically no order is needed, but requests one be sent anyways. No congestion or coughing. Breathing has been doing well.  ROS  General: (-) fever, (-) chills, (-) night sweats, (-) weakness Skin: (-) rashes, (-) itching,. Eyes: (-) visual changes, (-) redness, (-) itching. Nose and Sinuses: (-) nasal stuffiness or itchiness, (-) postnasal drip, (-) nosebleeds, (-) sinus trouble. Mouth and Throat: (-) sore throat, (-) hoarseness. Neck: (-) swollen glands, (-) enlarged thyroid, (-) neck pain. Respiratory: - cough, (-) bloody sputum, - shortness of breath, - wheezing. Cardiovascular: - ankle swelling, (-) chest pain. Lymphatic: (-) lymph node enlargement. Neurologic: (-) numbness, (-) tingling. Psychiatric: (-) anxiety, (-) depression   Current Medication: Outpatient Encounter Medications as of 10/13/2023  Medication Sig   albuterol (PROVENTIL) (2.5 MG/3ML) 0.083% nebulizer solution Take 3 mLs (2.5 mg total) by nebulization every 6 (six) hours as needed for wheezing or shortness of breath.   budesonide-formoterol (SYMBICORT) 160-4.5 MCG/ACT inhaler TAKE 2 PUFFS BY MOUTH TWICE A DAY   diclofenac (VOLTAREN) 75 MG EC tablet Take 75 mg by mouth 2 (two) times daily.   meloxicam (MOBIC) 15 MG tablet    OXYGEN Inhale into the lungs. 2 litre at night AHP   tiotropium (SPIRIVA) 18 MCG inhalation  capsule INHALE 1 CAPSULE VIA HANDIHALER ONCE DAILY AT THE SAME TIME EVERY DAY STRENGTH: 18 MCG   [DISCONTINUED] albuterol (VENTOLIN HFA) 108 (90 Base) MCG/ACT inhaler TAKE 2 PUFFS BY MOUTH EVERY 4 TO 6 HOURS AS NEEDED   [DISCONTINUED] Influenza vac split quadrivalent PF (FLUZONE HIGH-DOSE) 0.5 ML injection Inject 0.5 mLs into the muscle once.   albuterol (VENTOLIN HFA) 108 (90 Base) MCG/ACT inhaler TAKE 2 PUFFS BY MOUTH EVERY 4 TO 6 HOURS AS NEEDED   Influenza vac split quadrivalent PF (FLUZONE HIGH-DOSE) 0.5 ML injection Inject 0.5 mLs into the muscle once for 1 dose.   No facility-administered encounter medications on file as of 10/13/2023.    Surgical History: Past Surgical History:  Procedure Laterality Date   SPINE SURGERY     6 in butterbliss cyst on base of spine 1977    Medical History: Past Medical History:  Diagnosis Date   Anxiety    Asthma    COPD (chronic obstructive pulmonary disease) (HCC)    Environmental allergies    Sinusitis     Family History: Family History  Problem Relation Age of Onset   Colon cancer Mother    Hypertension Father     Social History: Social History   Socioeconomic History   Marital status: Single    Spouse name: Not on file   Number of children: Not on file   Years of education: Not on file   Highest education level: Not on file  Occupational History   Not on file  Tobacco Use   Smoking status: Former   Smokeless tobacco: Never  Substance and Sexual Activity  Alcohol use: Not Currently    Comment: occational   Drug use: No   Sexual activity: Not on file  Other Topics Concern   Not on file  Social History Narrative   Not on file   Social Determinants of Health   Financial Resource Strain: Low Risk  (02/25/2023)   Received from [facility], [facility]   Overall Financial Resource Strain (CARDIA)    Difficulty of Paying Living Expenses: Not hard at all  Food Insecurity: No Food  Insecurity (02/25/2023)   Received from [facility], [facility]   Hunger Vital Sign    Worried About Running Out of Food in the Last Year: Never true    Ran Out of Food in the Last Year: Never true  Transportation Needs: No Transportation Needs (02/25/2023)   Received from [facility], [facility]   [facility] - Transportation    In the past 12 months, has lack of transportation kept you from medical appointments or from getting medications?: No    Lack of Transportation (Non-Medical): No  Physical Activity: Not on file  Stress: Not on file  Social Connections: Not on file  Intimate Partner Violence: Not on file    Vital Signs: Blood pressure (!) 140/70, pulse 92, temperature 98 F (36.7 C), resp. rate 16, height 5\' 7"  (1.702 m), weight 167 lb 6.4 oz (75.9 kg), SpO2 95%.  Examination: General Appearance: The patient is well-developed, well-nourished, and in no distress. Skin: Gross inspection of skin unremarkable. Head: normocephalic, no gross deformities. Eyes: no gross deformities noted. ENT: ears appear grossly normal no exudates. Neck: Supple. No thyromegaly. No LAD. Respiratory: Lungs clear to ausculation. Cardiovascular: Normal S1 and S2 without murmur or rub. Extremities: No cyanosis. pulses are equal. Neurologic: Alert and oriented. No involuntary movements.  LABS: No results found for this or any previous visit (from the past 2160 hour(s)).  Radiology: DG Chest 2 View  Result Date: 02/08/2019 CLINICAL DATA:  72 y/o  M; chest pain and productive cough. EXAM: CHEST - 2 VIEW COMPARISON:  07/14/2018 chest radiograph FINDINGS: Stable heart size and mediastinal contours are within normal limits. Both lungs are clear. The visualized skeletal structures are unremarkable. IMPRESSION: No acute pulmonary process identified. Electronically Signed   By: Mitzi Hansen M.D.   On: 02/08/2019 15:07    No  results found.  No results found.    Assessment and Plan: Patient Active Problem List   Diagnosis Date Noted   COPD (chronic obstructive pulmonary disease) (HCC) 01/10/2018   Emphysema, unspecified (HCC) 01/10/2018   Shortness of breath 01/10/2018   Prostate cancer (HCC) 12/21/2016   Elevated PSA 12/02/2016    1. Chronic obstructive pulmonary disease, unspecified COPD type (HCC) Continue inhalers as prescribed. Will order annual PFT to be done in spring - albuterol (VENTOLIN HFA) 108 (90 Base) MCG/ACT inhaler; TAKE 2 PUFFS BY MOUTH EVERY 4 TO 6 HOURS AS NEEDED  Dispense: 8.5 each; Refill: 3 - Pulmonary Function Test; Future  2. Nocturnal hypoxia Will place order for humidifier to be added to nightly oxygen - For home use only DME oxygen  3. Needs flu shot Will send order for flu shot per pt request   General Counseling: I have discussed the findings of the evaluation and examination with Alinda Money.  I have also discussed any further diagnostic evaluation thatmay be needed or ordered today. Jag verbalizes understanding of the findings of todays visit. We also  reviewed his medications today and discussed drug interactions and side effects including but not limited excessive drowsiness and altered mental states. We also discussed that there is always a risk not just to him but also people around him. he has been encouraged to call the office with any questions or concerns that should arise related to todays visit.  Orders Placed This Encounter  Procedures   For home use only DME oxygen    Pt needs humidifier for current nocturnal oxygen    Order Specific Question:   Length of Need    Answer:   Lifetime    Order Specific Question:   Mode or (Route)    Answer:   Nasal cannula    Order Specific Question:   Frequency    Answer:   Only at night (stationary unit needed)    Order Specific Question:   Oxygen conserving device    Answer:   No    Order Specific Question:   Oxygen delivery  system    Answer:   Gas   Pulmonary Function Test    Standing Status:   Future    Standing Expiration Date:   10/12/2024    Order Specific Question:   Where should this test be performed?    Answer:   [facility]    Order Specific Question:   Full PFT: includes the following: basic spirometry, spirometry pre & post bronchodilator, diffusion capacity (DLCO), lung volumes    Answer:   Full PFT     Time spent: 30  I have personally obtained a history, examined the patient, evaluated laboratory and imaging results, formulated the assessment and plan and placed orders. This patient was seen by Lynn Ito, PA-C in collaboration with Dr. Freda Munro as a part of collaborative care agreement.     Yevonne Pax, MD [facility] Pulmonary and Critical Care Sleep medicine

## 2023-10-14 ENCOUNTER — Telehealth: Payer: Self-pay

## 2023-10-14 NOTE — Telephone Encounter (Signed)
Sent message to [facility] for Humidifier

## 2023-12-20 DIAGNOSIS — M1711 Unilateral primary osteoarthritis, right knee: Secondary | ICD-10-CM | POA: Diagnosis not present

## 2024-01-06 ENCOUNTER — Other Ambulatory Visit: Payer: Self-pay | Admitting: Internal Medicine

## 2024-02-16 ENCOUNTER — Other Ambulatory Visit: Payer: Self-pay | Admitting: Physician Assistant

## 2024-02-16 DIAGNOSIS — J449 Chronic obstructive pulmonary disease, unspecified: Secondary | ICD-10-CM

## 2024-02-29 ENCOUNTER — Encounter: Payer: PPO | Admitting: Internal Medicine

## 2024-03-01 ENCOUNTER — Other Ambulatory Visit: Payer: Self-pay | Admitting: Internal Medicine

## 2024-03-01 DIAGNOSIS — J449 Chronic obstructive pulmonary disease, unspecified: Secondary | ICD-10-CM

## 2024-03-05 DIAGNOSIS — Z0000 Encounter for general adult medical examination without abnormal findings: Secondary | ICD-10-CM | POA: Diagnosis not present

## 2024-03-05 DIAGNOSIS — Z1211 Encounter for screening for malignant neoplasm of colon: Secondary | ICD-10-CM | POA: Diagnosis not present

## 2024-03-05 DIAGNOSIS — N1831 Chronic kidney disease, stage 3a: Secondary | ICD-10-CM | POA: Diagnosis not present

## 2024-03-05 DIAGNOSIS — R972 Elevated prostate specific antigen [PSA]: Secondary | ICD-10-CM | POA: Diagnosis not present

## 2024-03-05 DIAGNOSIS — Z1212 Encounter for screening for malignant neoplasm of rectum: Secondary | ICD-10-CM | POA: Diagnosis not present

## 2024-03-05 DIAGNOSIS — D649 Anemia, unspecified: Secondary | ICD-10-CM | POA: Diagnosis not present

## 2024-03-05 DIAGNOSIS — J449 Chronic obstructive pulmonary disease, unspecified: Secondary | ICD-10-CM | POA: Diagnosis not present

## 2024-03-05 DIAGNOSIS — M179 Osteoarthritis of knee, unspecified: Secondary | ICD-10-CM | POA: Diagnosis not present

## 2024-03-05 DIAGNOSIS — C61 Malignant neoplasm of prostate: Secondary | ICD-10-CM | POA: Diagnosis not present

## 2024-03-05 DIAGNOSIS — I10 Essential (primary) hypertension: Secondary | ICD-10-CM | POA: Diagnosis not present

## 2024-03-15 ENCOUNTER — Ambulatory Visit: Payer: PPO | Admitting: Physician Assistant

## 2024-03-28 ENCOUNTER — Ambulatory Visit (INDEPENDENT_AMBULATORY_CARE_PROVIDER_SITE_OTHER): Payer: PPO | Admitting: Internal Medicine

## 2024-03-28 DIAGNOSIS — J449 Chronic obstructive pulmonary disease, unspecified: Secondary | ICD-10-CM

## 2024-03-28 DIAGNOSIS — R0602 Shortness of breath: Secondary | ICD-10-CM | POA: Diagnosis not present

## 2024-04-02 NOTE — Procedures (Signed)
 [facility] [address] [place], [address]    Complete Pulmonary Function Testing Interpretation:  FINDINGS:   With forced vital capacity is moderately decreased.  FEV1 is 1.08 L and is 38% predicted which is severely decreased.  FEV1 FVC ratio is severely reduced.  Post bronchodilator there was no significant change in the FEV1.  Total lung capacity is mildly decreased.  Residual volume is increased.  FRC is increased.  DLCO is severely decreased.  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  Clinical correlation is recommended  Cordie Deters, MD [facility] Pulmonary Critical Care Medicine Sleep Medicine

## 2024-04-06 ENCOUNTER — Other Ambulatory Visit: Payer: Self-pay

## 2024-04-06 MED ORDER — TIOTROPIUM BROMIDE MONOHYDRATE 18 MCG IN CAPS: ORAL_CAPSULE | RESPIRATORY_TRACT | 1 refills | Status: DC

## 2024-04-10 LAB — PULMONARY FUNCTION TEST

## 2024-04-16 ENCOUNTER — Ambulatory Visit: Payer: PPO | Admitting: Physician Assistant

## 2024-04-16 ENCOUNTER — Encounter: Payer: Self-pay | Admitting: Physician Assistant

## 2024-04-16 VITALS — BP 130/80 | HR 75 | Temp 97.8°F | Resp 16 | Ht 67.0 in | Wt 164.0 lb

## 2024-04-16 DIAGNOSIS — J449 Chronic obstructive pulmonary disease, unspecified: Secondary | ICD-10-CM

## 2024-04-16 DIAGNOSIS — G4734 Idiopathic sleep related nonobstructive alveolar hypoventilation: Secondary | ICD-10-CM | POA: Diagnosis not present

## 2024-04-16 NOTE — Progress Notes (Signed)
 [facility] [address] [place], [place] [address]  Pulmonary Sleep Medicine   Office Visit Note  Patient Name: [name] DOB: 06-26-1951 MRN [id number]  Date of Service: 04/16/2024  Complaints/HPI: Pt is here for routine pulmonary follow up. Breathing has been stable. PFT reviewed and is stable. Only SOB with high level exertion, such as carrying something in the heat. Does not feel SOB with walking typically. Uses albuterol  as needed and using symbicort  and spiriva  daily. Not wearing oxygen  at night every night. Wears most nights though. Thought when no apnea seen that he might not need the oxygen , but does think it helps. Discussed oxygen  can still drop even without apnea findings and he will go back to using nightly.  ROS  General: (-) fever, (-) chills, (-) night sweats, (-) weakness Skin: (-) rashes, (-) itching,. Eyes: (-) visual changes, (-) redness, (-) itching. Nose and Sinuses: (-) nasal stuffiness or itchiness, (-) postnasal drip, (-) nosebleeds, (-) sinus trouble. Mouth and Throat: (-) sore throat, (-) hoarseness. Neck: (-) swollen glands, (-) enlarged thyroid , (-) neck pain. Respiratory: - cough, (-) bloody sputum, + shortness of breath, - wheezing. Cardiovascular: - ankle swelling, (-) chest pain. Lymphatic: (-) lymph node enlargement. Neurologic: (-) numbness, (-) tingling. Psychiatric: (-) anxiety, (-) depression   Current Medication: Outpatient Encounter Medications as of 04/16/2024  Medication Sig   albuterol  (PROVENTIL ) (2.5 MG/3ML) 0.083% nebulizer solution Take 3 mLs (2.5 mg total) by nebulization every 6 (six) hours as needed for wheezing or shortness of breath.   albuterol  (VENTOLIN  HFA) 108 (90 Base) MCG/ACT inhaler INHALE 2 PUFFS BY MOUTH EVERY 4 TO 6 HOURS AS NEEDED   budesonide -formoterol  (SYMBICORT ) 160-4.5 MCG/ACT inhaler TAKE 2 PUFFS BY MOUTH TWICE A DAY   diclofenac (VOLTAREN) 75 MG EC tablet Take 75 mg by mouth 2 (two) times daily.    meloxicam (MOBIC) 15 MG tablet    OXYGEN  Inhale into the lungs. 2 litre at night AHP   tiotropium (SPIRIVA ) 18 MCG inhalation capsule INHALE 1 CAPSULE VIA HANDIHALER ONCE DAILY AT THE SAME TIME EVERY DAY STRENGTH: 18 MCG   No facility-administered encounter medications on file as of 04/16/2024.    Surgical History: Past Surgical History:  Procedure Laterality Date   SPINE SURGERY     6 in butterbliss cyst on base of spine 1977    Medical History: Past Medical History:  Diagnosis Date   Anxiety    Asthma    COPD (chronic obstructive pulmonary disease) (HCC)    Environmental allergies    Sinusitis     Family History: Family History  Problem Relation Age of Onset   Colon cancer Mother    Hypertension Father     Social History: Social History   Socioeconomic History   Marital status: Single    Spouse name: Not on file   Number of children: Not on file   Years of education: Not on file   Highest education level: Not on file  Occupational History   Not on file  Tobacco Use   Smoking status: Former   Smokeless tobacco: Never  Substance and Sexual Activity   Alcohol use: Not Currently    Comment: occational   Drug use: No   Sexual activity: Not on file  Other Topics Concern   Not on file  Social History Narrative   Not on file   Social Drivers of Health   Financial Resource Strain: Low Risk  (03/05/2024)   Received from [facility]   Overall Financial Resource Strain (CARDIA)    Difficulty of Paying Living Expenses: Not hard at all  Food Insecurity: No Food Insecurity (03/05/2024)   Received from [facility]   Hunger Vital Sign    Worried About Running Out of Food in the Last Year: Never true    Ran Out of Food in the Last Year: Never true  Transportation Needs: No Transportation Needs (03/05/2024)   Received from [facility] - Transportation    In the past 12 months, has lack of transportation  kept you from medical appointments or from getting medications?: No    Lack of Transportation (Non-Medical): No  Physical Activity: Not on file  Stress: Not on file  Social Connections: Not on file  Intimate Partner Violence: Not on file    Vital Signs: Blood pressure 130/80, pulse 75, temperature 97.8 F (36.6 C), resp. rate 16, height 5\' 7"  (1.702 m), weight 164 lb (74.4 kg), SpO2 96%.  Examination: General Appearance: The patient is well-developed, well-nourished, and in no distress. Skin: Gross inspection of skin unremarkable. Head: normocephalic, no gross deformities. Eyes: no gross deformities noted. ENT: ears appear grossly normal no exudates. Neck: Supple. No thyromegaly. No LAD. Respiratory: Lungs clear to auscultation. Cardiovascular: Normal S1 and S2 without murmur or rub. Extremities: No cyanosis. pulses are equal. Neurologic: Alert and oriented. No involuntary movements.  LABS: Recent Results (from the past 2160 hours)  Pulmonary Function Test     Status: None   Collection Time: 04/10/24  9:39 AM  Result Value Ref Range   FEV1     FVC     FEV1/FVC     TLC     DLCO      Radiology: DG Chest 2 View Result Date: 02/08/2019 CLINICAL DATA:  73 y/o  M; chest pain and productive cough. EXAM: CHEST - 2 VIEW COMPARISON:  07/14/2018 chest radiograph FINDINGS: Stable heart size and mediastinal contours are within normal limits. Both lungs are clear. The visualized skeletal structures are unremarkable. IMPRESSION: No acute pulmonary process identified. Electronically Signed   By: Dorlene Gary M.D.   On: 02/08/2019 15:07    No results found.  No results found.    Assessment and Plan: Patient Active Problem List   Diagnosis Date Noted   COPD (chronic obstructive pulmonary disease) (HCC) 01/10/2018   Emphysema, unspecified (HCC) 01/10/2018   Shortness of breath 01/10/2018   Prostate cancer (HCC) 12/21/2016   Elevated PSA 12/02/2016    1. Chronic  obstructive pulmonary disease, unspecified COPD type (HCC) (Primary) Continue inhalers as before  2. Nocturnal hypoxia Continue oxygen  as before    General Counseling: I have discussed the findings of the evaluation and examination with Baker Bon.  I have also discussed any further diagnostic evaluation thatmay be needed or ordered today. Tyrese verbalizes understanding of the findings of todays visit. We also reviewed his medications today and discussed drug interactions and side effects including but not limited excessive drowsiness and altered mental states. We also discussed that there is always a risk not just to him but also people around him. he has been encouraged to call the office with any questions or concerns that should arise related to todays visit.  No orders of the defined types were placed in this encounter.    Time spent: 30  I have personally obtained a history, examined the patient, evaluated laboratory and imaging results, formulated the assessment and plan and placed orders.  This patient was seen by Taylor Favia, PA-C in collaboration with Dr. Cam Cava as a part of collaborative care agreement.     Cordie Deters, MD [facility] Pulmonary and Critical Care Sleep medicine

## 2024-06-13 DIAGNOSIS — M1711 Unilateral primary osteoarthritis, right knee: Secondary | ICD-10-CM | POA: Diagnosis not present

## 2024-06-14 ENCOUNTER — Other Ambulatory Visit: Payer: Self-pay | Admitting: Physician Assistant

## 2024-06-14 DIAGNOSIS — J449 Chronic obstructive pulmonary disease, unspecified: Secondary | ICD-10-CM

## 2024-07-19 DIAGNOSIS — I10 Essential (primary) hypertension: Secondary | ICD-10-CM | POA: Diagnosis not present

## 2024-07-19 DIAGNOSIS — R972 Elevated prostate specific antigen [PSA]: Secondary | ICD-10-CM | POA: Diagnosis not present

## 2024-07-19 DIAGNOSIS — M179 Osteoarthritis of knee, unspecified: Secondary | ICD-10-CM | POA: Diagnosis not present

## 2024-07-19 DIAGNOSIS — N1831 Chronic kidney disease, stage 3a: Secondary | ICD-10-CM | POA: Diagnosis not present

## 2024-07-19 DIAGNOSIS — C61 Malignant neoplasm of prostate: Secondary | ICD-10-CM | POA: Diagnosis not present

## 2024-07-19 DIAGNOSIS — D649 Anemia, unspecified: Secondary | ICD-10-CM | POA: Diagnosis not present

## 2024-07-19 DIAGNOSIS — J449 Chronic obstructive pulmonary disease, unspecified: Secondary | ICD-10-CM | POA: Diagnosis not present

## 2024-10-15 ENCOUNTER — Ambulatory Visit (INDEPENDENT_AMBULATORY_CARE_PROVIDER_SITE_OTHER): Admitting: Physician Assistant

## 2024-10-15 ENCOUNTER — Encounter: Payer: Self-pay | Admitting: Physician Assistant

## 2024-10-15 VITALS — BP 123/87 | HR 73 | Temp 98.3°F | Resp 16 | Ht 67.0 in | Wt 157.0 lb

## 2024-10-15 DIAGNOSIS — G4734 Idiopathic sleep related nonobstructive alveolar hypoventilation: Secondary | ICD-10-CM | POA: Diagnosis not present

## 2024-10-15 DIAGNOSIS — J449 Chronic obstructive pulmonary disease, unspecified: Secondary | ICD-10-CM

## 2024-10-15 NOTE — Progress Notes (Deleted)
 [facility] [address] [place], [place] [address]  Internal MEDICINE  Office Visit Note  Patient Name: [name]  [id number]  [id number]  Date of Service: 10/15/2024  Chief Complaint  Patient presents with   Follow-up   COPD   Asthma   Medication Management    Wants to discuss inhalers    HPI     Current Medication: Outpatient Encounter Medications as of 10/15/2024  Medication Sig   albuterol  (PROVENTIL ) (2.5 MG/3ML) 0.083% nebulizer solution Take 3 mLs (2.5 mg total) by nebulization every 6 (six) hours as needed for wheezing or shortness of breath.   albuterol  (VENTOLIN  HFA) 108 (90 Base) MCG/ACT inhaler TAKE 2 PUFFS BY MOUTH EVERY 4 TO 6 HOURS AS NEEDED   budesonide -formoterol  (SYMBICORT ) 160-4.5 MCG/ACT inhaler TAKE 2 PUFFS BY MOUTH TWICE A DAY   meloxicam (MOBIC) 15 MG tablet    OXYGEN  Inhale into the lungs. 2 litre at night AHP   tiotropium (SPIRIVA ) 18 MCG inhalation capsule INHALE 1 CAPSULE VIA HANDIHALER ONCE DAILY AT THE SAME TIME EVERY DAY STRENGTH: 18 MCG   [DISCONTINUED] diclofenac (VOLTAREN) 75 MG EC tablet Take 75 mg by mouth 2 (two) times daily.   No facility-administered encounter medications on file as of 10/15/2024.    Surgical History: Past Surgical History:  Procedure Laterality Date   SPINE SURGERY     6 in butterbliss cyst on base of spine 1977    Medical History: Past Medical History:  Diagnosis Date   Anxiety    Asthma    COPD (chronic obstructive pulmonary disease) (HCC)    Environmental allergies    Sinusitis     Family History: Family History  Problem Relation Age of Onset   Colon cancer Mother    Hypertension Father     Social History   Socioeconomic History   Marital status: Single    Spouse name: Not on file   Number of children: Not on file   Years of education: Not on file   Highest education level: Not on file  Occupational History   Not on file  Tobacco Use   Smoking status: Former    Smokeless tobacco: Never  Substance and Sexual Activity   Alcohol use: Not Currently    Comment: occational   Drug use: No   Sexual activity: Not on file  Other Topics Concern   Not on file  Social History Narrative   Not on file   Social Drivers of Health   Financial Resource Strain: Low Risk  (03/05/2024)   Received from [facility]   Overall Financial Resource Strain (CARDIA)    Difficulty of Paying Living Expenses: Not hard at all  Food Insecurity: No Food Insecurity (03/05/2024)   Received [facility]   Hunger Vital Sign    Within the past 12 months, you worried that your food would run out before you got the money to buy more.: Never true    Within the past 12 months, the food you bought just didn't last and you didn't have money to get more.: Never true  Transportation Needs: No Transportation Needs (03/05/2024)   Received from [facility] - Transportation    In the past 12 months, has lack of transportation kept you from medical appointments or from getting medications?: No    Lack of Transportation (Non-Medical): No  Physical Activity: Not on file  Stress: Not on file  Social Connections: Not on  file  Intimate Partner Violence: Not on file      Review of Systems  Vital Signs: BP 123/87   Pulse 73   Temp 98.3 F (36.8 C)   Resp 16   Ht 5' 7 (1.702 m)   Wt 157 lb (71.2 kg)   SpO2 98%   BMI 24.59 kg/m    Physical Exam     Assessment/Plan:   General Counseling: [name] verbalizes understanding of the findings of todays visit and agrees with plan of treatment. I have discussed any further diagnostic evaluation that may be needed or ordered today. We also reviewed his medications today. he has been encouraged to call the office with any questions or concerns that should arise related to todays visit.    No orders of the defined types were placed in this encounter.   No orders of the  defined types were placed in this encounter.   This patient was seen by Tinnie Pro, PA-C in collaboration with Dr. Sigrid Bathe as a part of collaborative care agreement.   Total time spent:*** Minutes Time spent includes review of chart, medications, test results, and follow up plan with the patient.      Dr Fozia M Khan Internal medicine

## 2024-10-15 NOTE — Progress Notes (Signed)
 [facility] [address] [place], [place] [address]  Pulmonary Sleep Medicine   Office Visit Note  Patient Name: [name] DOB: [birth year] MRN [id number]  Date of Service: 10/15/2024  Complaints/HPI: Pt is here for routine pulmonary follow up. Getting over a cold now. States he is using mucinex. States his albuterol  changed and doesn't work quite as well as his old red inhaler. Using spiriva  and symbicort . SOB at times, but overall doing well. No wheezing now. Wearing ~2.5L oxygen  at night due to longer hose now with humidifier. No more nose bleeds.  ROS  General: (-) fever, (-) chills, (-) night sweats, (-) weakness Skin: (-) rashes, (-) itching,. Eyes: (-) visual changes, (-) redness, (-) itching. Nose and Sinuses: (-) nasal stuffiness or itchiness, (-) postnasal drip, (-) nosebleeds, (-) sinus trouble. Mouth and Throat: (-) sore throat, (-) hoarseness. Neck: (-) swollen glands, (-) enlarged thyroid , (-) neck pain. Respiratory: + cough, (-) bloody sputum, + shortness of breath, - wheezing. Cardiovascular: - ankle swelling, (-) chest pain. Lymphatic: (-) lymph node enlargement. Neurologic: (-) numbness, (-) tingling. Psychiatric: (-) anxiety, (-) depression   Current Medication: Outpatient Encounter Medications as of 10/15/2024  Medication Sig   albuterol  (PROVENTIL ) (2.5 MG/3ML) 0.083% nebulizer solution Take 3 mLs (2.5 mg total) by nebulization every 6 (six) hours as needed for wheezing or shortness of breath.   albuterol  (VENTOLIN  HFA) 108 (90 Base) MCG/ACT inhaler TAKE 2 PUFFS BY MOUTH EVERY 4 TO 6 HOURS AS NEEDED   budesonide -formoterol  (SYMBICORT ) 160-4.5 MCG/ACT inhaler TAKE 2 PUFFS BY MOUTH TWICE A DAY   meloxicam (MOBIC) 15 MG tablet    OXYGEN  Inhale into the lungs. 2 litre at night AHP   tiotropium (SPIRIVA ) 18 MCG inhalation capsule INHALE 1 CAPSULE VIA HANDIHALER ONCE DAILY AT THE SAME TIME EVERY DAY STRENGTH: 18 MCG   [DISCONTINUED] diclofenac  (VOLTAREN) 75 MG EC tablet Take 75 mg by mouth 2 (two) times daily.   No facility-administered encounter medications on file as of 10/15/2024.    Surgical History: Past Surgical History:  Procedure Laterality Date   SPINE SURGERY     6 in butterbliss cyst on base of spine 1977    Medical History: Past Medical History:  Diagnosis Date   Anxiety    Asthma    COPD (chronic obstructive pulmonary disease) (HCC)    Environmental allergies    Sinusitis     Family History: Family History  Problem Relation Age of Onset   Colon cancer Mother    Hypertension Father     Social History: Social History   Socioeconomic History   Marital status: Single    Spouse name: Not on file   Number of children: Not on file   Years of education: Not on file   Highest education level: Not on file  Occupational History   Not on file  Tobacco Use   Smoking status: Former   Smokeless tobacco: Never  Substance and Sexual Activity   Alcohol use: Not Currently    Comment: occational   Drug use: No   Sexual activity: Not on file  Other Topics Concern   Not on file  Social History Narrative   Not on file   Social Drivers of Health   Financial Resource Strain: Low Risk  (03/05/2024)   [facility]   Overall Financial Resource Strain (CARDIA)    Difficulty of Paying Living Expenses: Not hard at all  Food Insecurity: No Food Insecurity (03/05/2024)  Received from [facility]   Hunger Vital Sign    Within the past 12 months, you worried that your food would run out before you got the money to buy more.: Never true    Within the past 12 months, the food you bought just didn't last and you didn't have money to get more.: Never true  Transportation Needs: No Transportation Needs (03/05/2024)   Received from [facility] - Transportation    In the past 12 months, has lack of transportation kept you from medical  appointments or from getting medications?: No    Lack of Transportation (Non-Medical): No  Physical Activity: Not on file  Stress: Not on file  Social Connections: Not on file  Intimate Partner Violence: Not on file    Vital Signs: Blood pressure 123/87, pulse 73, temperature 98.3 F (36.8 C), resp. rate 16, height 5' 7 (1.702 m), weight 157 lb (71.2 kg), SpO2 98%.  Examination: General Appearance: The patient is well-developed, well-nourished, and in no distress. Skin: Gross inspection of skin unremarkable. Head: normocephalic, no gross deformities. Eyes: no gross deformities noted. ENT: ears appear grossly normal no exudates. Neck: Supple. No thyromegaly. No LAD. Respiratory: No wheezing or rhonchi. Cardiovascular: Normal S1 and S2 without murmur or rub. Extremities: No cyanosis. pulses are equal. Neurologic: Alert and oriented. No involuntary movements.  LABS: No results found for this or any previous visit (from the past 2160 hours).  Radiology: DG Chest 2 View Result Date: 02/08/2019 CLINICAL DATA:  73 y/o  M; chest pain and productive cough. EXAM: CHEST - 2 VIEW COMPARISON:  07/14/2018 chest radiograph FINDINGS: Stable heart size and mediastinal contours are within normal limits. Both lungs are clear. The visualized skeletal structures are unremarkable. IMPRESSION: No acute pulmonary process identified. Electronically Signed   By: Gustavo Mary M.D.   On: 02/08/2019 15:07    No results found.  No results found.    Assessment and Plan: Patient Active Problem List   Diagnosis Date Noted   COPD (chronic obstructive pulmonary disease) (HCC) 01/10/2018   Emphysema, unspecified (HCC) 01/10/2018   Shortness of breath 01/10/2018   Prostate cancer (HCC) 12/21/2016   Elevated PSA 12/02/2016    1. Chronic obstructive pulmonary disease, unspecified COPD type (HCC) (Primary) Continue inhalers as before, PFT scheduled  2. Nocturnal hypoxia Continue oxygen  as  before   General Counseling: I have discussed the findings of the evaluation and examination with [name].  I have also discussed any further diagnostic evaluation thatmay be needed or ordered today. [name] verbalizes understanding of the findings of todays visit. We also reviewed his medications today and discussed drug interactions and side effects including but not limited excessive drowsiness and altered mental states. We also discussed that there is always a risk not just to him but also people around him. he has been encouraged to call the office with any questions or concerns that should arise related to todays visit.  No orders of the defined types were placed in this encounter.    Time spent: 30  I have personally obtained a history, examined the patient, evaluated laboratory and imaging results, formulated the assessment and plan and placed orders. This patient was seen by Tinnie Pro, PA-C in collaboration with Dr. Elfreda Bathe as a part of collaborative care agreement.     Elfreda DELENA Bathe, MD [facility] Pulmonary and Critical Care Sleep medicine

## 2024-11-01 NOTE — Progress Notes (Signed)
 [name]                                          MRN: [id number]   11/01/2024   The [facility] Quality Team Specialist reviewed this patient medical record for the purposes of chart review for care gap closure. The following were reviewed: abstraction for care gap closure-controlling blood pressure.    [facility] Quality Team

## 2024-12-14 ENCOUNTER — Other Ambulatory Visit: Payer: Self-pay | Admitting: Physician Assistant

## 2024-12-14 DIAGNOSIS — J449 Chronic obstructive pulmonary disease, unspecified: Secondary | ICD-10-CM

## 2025-01-01 ENCOUNTER — Other Ambulatory Visit: Payer: Self-pay | Admitting: Internal Medicine

## 2025-01-08 ENCOUNTER — Ambulatory Visit: Admitting: Urology

## 2025-01-18 ENCOUNTER — Ambulatory Visit: Admitting: Urology

## 2025-01-18 VITALS — BP 114/66 | HR 69 | Ht 67.0 in | Wt 150.0 lb

## 2025-01-18 DIAGNOSIS — Z8546 Personal history of malignant neoplasm of prostate: Secondary | ICD-10-CM

## 2025-01-18 NOTE — Progress Notes (Unsigned)
 "  01/18/2025 9:20 AM   [name] 28-May-1951 [id number]  Referring provider: Epifanio Alm SQUIBB, MD [address] [place],  [place] [address]  Chief Complaint  Patient presents with   Elevated PSA    HPI: [name] is a 74 y.o. male referred for evaluation of an elevated PSA  TRUS/biopsy prostate 12/02/2016 for a PSA of 6.3 Prostate volume < 20 cc Path Gleason 3+4 adenocarcinoma 1/12 cores Seen [facility] multidisciplinary oncology clinic by urologic oncology and radiation oncology He elected active surveillance however did not have regular urology follow-up PSA checked by PCP have been rising  PSA trend    Prostate Specific Ag, Serum  Latest Ref Rng 0.0 - 4.0 ng/mL                        PMH: Past Medical History:  Diagnosis Date   Anxiety    Asthma    COPD (chronic obstructive pulmonary disease) (HCC)    Environmental allergies    Sinusitis     Surgical History: Past Surgical History:  Procedure Laterality Date   SPINE SURGERY     6 in butterbliss cyst on base of spine 1977    Home Medications:  Allergies as of 01/18/2025       Reactions   Penicillins Anaphylaxis   Codeine Rash        Medication List        Accurate as of January 18, 2025  9:20 AM. If you have any questions, ask your nurse or doctor.          albuterol  (2.5 MG/3ML) 0.083% nebulizer solution Commonly known as: PROVENTIL  Take 3 mLs (2.5 mg total) by nebulization every 6 (six) hours as needed for wheezing or shortness of breath.   albuterol  108 (90 Base) MCG/ACT inhaler Commonly known as: VENTOLIN  HFA INHALE 2 PUFFS BY MOUTH EVERY 4 TO 6 HOURS AS NEEDED   amLODipine 10 MG tablet Commonly known as: NORVASC Take 10 mg by mouth daily.   budesonide -formoterol  160-4.5 MCG/ACT inhaler Commonly known as: SYMBICORT  TAKE 2 PUFFS BY MOUTH TWICE A DAY   losartan 100 MG tablet Commonly known as: COZAAR Take 100 mg by mouth daily.   meloxicam 15 MG  tablet Commonly known as: MOBIC   OXYGEN  Inhale into the lungs. 2 litre at night AHP   Tiotropium Bromide  18 MCG Caps INHALE 1 CAPSULE VIA HANDIHALER ONCE DAILY AT THE SAME TIME EVERY DAY STRENGTH: 18 MCG        Allergies: Allergies[1]  Family History: Family History  Problem Relation Age of Onset   Colon cancer Mother    Hypertension Father     Social History:  reports that he has quit smoking. He has never used smokeless tobacco. He reports that he does not currently use alcohol. He reports that he does not use drugs.   Physical Exam: BP 114/66   Pulse 69   Ht 5' 7 (1.702 m)   Wt 150 lb (68 kg)   BMI 23.49 kg/m   Constitutional:  Alert, No acute distress. HEENT: [place] AT Respiratory: Normal respiratory effort, no increased work of breathing. GU: Prostate 30 g, smooth without nodules or induration Psychiatric: Normal mood and affect.   Assessment & Plan:    1.  Prostate cancer Significant PSA elevation DRE unremarkable Recommend further evaluation with PSMA/PET ***order   Dennis JAYSON Barba, MD  [facility] [place] [address] [place], [place]  [address] [phone]    [1]  Allergies Allergen Reactions   Penicillins Anaphylaxis   Codeine Rash   "

## 2025-04-10 ENCOUNTER — Encounter: Admitting: Internal Medicine

## 2025-04-22 ENCOUNTER — Ambulatory Visit: Admitting: Internal Medicine
